# Patient Record
Sex: Female | Born: 1935 | Race: White | Hispanic: No | Marital: Single
Health system: Southern US, Community
[De-identification: ages and names within clinical notes are randomized; demographics above are authoritative.]

---

## 2004-04-27 ENCOUNTER — Ambulatory Visit: Payer: Self-pay | Admitting: Physician Assistant

## 2004-05-21 ENCOUNTER — Ambulatory Visit: Payer: Self-pay | Admitting: Unknown Physician Specialty

## 2004-05-25 ENCOUNTER — Ambulatory Visit: Payer: Self-pay | Admitting: Unknown Physician Specialty

## 2005-02-07 ENCOUNTER — Ambulatory Visit: Payer: Self-pay | Admitting: Internal Medicine

## 2005-11-18 ENCOUNTER — Ambulatory Visit: Payer: Self-pay | Admitting: Rheumatology

## 2006-01-12 ENCOUNTER — Other Ambulatory Visit: Payer: Self-pay

## 2006-01-12 ENCOUNTER — Ambulatory Visit: Payer: Self-pay | Admitting: Unknown Physician Specialty

## 2006-01-19 ENCOUNTER — Ambulatory Visit: Payer: Self-pay | Admitting: Unknown Physician Specialty

## 2006-06-27 ENCOUNTER — Ambulatory Visit: Payer: Self-pay | Admitting: Internal Medicine

## 2006-07-04 ENCOUNTER — Ambulatory Visit: Payer: Self-pay | Admitting: Unknown Physician Specialty

## 2006-11-09 ENCOUNTER — Ambulatory Visit: Payer: Self-pay

## 2007-02-07 ENCOUNTER — Ambulatory Visit: Payer: Self-pay | Admitting: Internal Medicine

## 2007-02-15 ENCOUNTER — Ambulatory Visit: Payer: Self-pay | Admitting: Internal Medicine

## 2007-03-10 ENCOUNTER — Ambulatory Visit: Payer: Self-pay | Admitting: Internal Medicine

## 2007-04-09 ENCOUNTER — Ambulatory Visit: Payer: Self-pay | Admitting: Internal Medicine

## 2007-05-10 ENCOUNTER — Ambulatory Visit: Payer: Self-pay | Admitting: Internal Medicine

## 2007-06-10 ENCOUNTER — Ambulatory Visit: Payer: Self-pay | Admitting: Internal Medicine

## 2007-06-19 ENCOUNTER — Other Ambulatory Visit: Payer: Self-pay

## 2007-06-19 ENCOUNTER — Ambulatory Visit: Payer: Self-pay | Admitting: Obstetrics and Gynecology

## 2007-07-03 ENCOUNTER — Inpatient Hospital Stay: Payer: Self-pay | Admitting: Obstetrics and Gynecology

## 2007-07-08 ENCOUNTER — Ambulatory Visit: Payer: Self-pay | Admitting: Internal Medicine

## 2007-08-08 ENCOUNTER — Ambulatory Visit: Payer: Self-pay | Admitting: Internal Medicine

## 2007-08-10 ENCOUNTER — Ambulatory Visit: Payer: Self-pay | Admitting: Internal Medicine

## 2007-09-06 ENCOUNTER — Ambulatory Visit: Payer: Self-pay | Admitting: Internal Medicine

## 2007-09-07 ENCOUNTER — Ambulatory Visit: Payer: Self-pay | Admitting: Internal Medicine

## 2007-10-26 ENCOUNTER — Ambulatory Visit: Payer: Self-pay | Admitting: Internal Medicine

## 2007-10-31 ENCOUNTER — Ambulatory Visit: Payer: Self-pay | Admitting: Orthopedic Surgery

## 2007-11-01 ENCOUNTER — Ambulatory Visit: Payer: Self-pay | Admitting: Orthopedic Surgery

## 2007-11-07 ENCOUNTER — Ambulatory Visit: Payer: Self-pay | Admitting: Internal Medicine

## 2007-12-08 ENCOUNTER — Ambulatory Visit: Payer: Self-pay | Admitting: Internal Medicine

## 2008-01-08 ENCOUNTER — Ambulatory Visit: Payer: Self-pay | Admitting: Internal Medicine

## 2008-02-07 ENCOUNTER — Ambulatory Visit: Payer: Self-pay | Admitting: Internal Medicine

## 2008-03-09 ENCOUNTER — Ambulatory Visit: Payer: Self-pay | Admitting: Internal Medicine

## 2008-03-14 ENCOUNTER — Ambulatory Visit: Payer: Self-pay | Admitting: Internal Medicine

## 2008-04-08 ENCOUNTER — Ambulatory Visit: Payer: Self-pay | Admitting: Internal Medicine

## 2008-05-09 ENCOUNTER — Ambulatory Visit: Payer: Self-pay | Admitting: Internal Medicine

## 2008-06-09 ENCOUNTER — Ambulatory Visit: Payer: Self-pay | Admitting: Internal Medicine

## 2008-07-07 ENCOUNTER — Ambulatory Visit: Payer: Self-pay | Admitting: Internal Medicine

## 2008-08-07 ENCOUNTER — Ambulatory Visit: Payer: Self-pay | Admitting: Internal Medicine

## 2008-08-11 ENCOUNTER — Ambulatory Visit: Payer: Self-pay | Admitting: Internal Medicine

## 2008-08-29 ENCOUNTER — Emergency Department: Payer: Self-pay | Admitting: Emergency Medicine

## 2008-08-29 ENCOUNTER — Inpatient Hospital Stay (HOSPITAL_COMMUNITY): Admission: AD | Admit: 2008-08-29 | Discharge: 2008-08-30 | Payer: Self-pay | Admitting: Neurosurgery

## 2008-09-01 ENCOUNTER — Ambulatory Visit: Payer: Self-pay | Admitting: Orthopedic Surgery

## 2008-09-06 ENCOUNTER — Ambulatory Visit: Payer: Self-pay | Admitting: Internal Medicine

## 2008-09-11 ENCOUNTER — Ambulatory Visit: Payer: Self-pay | Admitting: Internal Medicine

## 2008-09-12 ENCOUNTER — Ambulatory Visit: Payer: Self-pay | Admitting: Internal Medicine

## 2008-10-02 ENCOUNTER — Ambulatory Visit: Payer: Self-pay | Admitting: Neurosurgery

## 2008-10-07 ENCOUNTER — Ambulatory Visit: Payer: Self-pay | Admitting: Internal Medicine

## 2008-11-06 ENCOUNTER — Ambulatory Visit: Payer: Self-pay | Admitting: Internal Medicine

## 2008-12-03 ENCOUNTER — Inpatient Hospital Stay (HOSPITAL_COMMUNITY): Admission: RE | Admit: 2008-12-03 | Discharge: 2008-12-05 | Payer: Self-pay | Admitting: Neurosurgery

## 2008-12-07 ENCOUNTER — Ambulatory Visit: Payer: Self-pay | Admitting: Internal Medicine

## 2009-01-07 ENCOUNTER — Ambulatory Visit: Payer: Self-pay | Admitting: Internal Medicine

## 2009-02-06 ENCOUNTER — Ambulatory Visit: Payer: Self-pay | Admitting: Internal Medicine

## 2009-03-09 ENCOUNTER — Ambulatory Visit: Payer: Self-pay | Admitting: Internal Medicine

## 2009-04-08 ENCOUNTER — Ambulatory Visit: Payer: Self-pay | Admitting: Internal Medicine

## 2009-05-09 ENCOUNTER — Ambulatory Visit: Payer: Self-pay | Admitting: Internal Medicine

## 2009-05-17 ENCOUNTER — Emergency Department: Payer: Self-pay | Admitting: Internal Medicine

## 2009-05-22 ENCOUNTER — Inpatient Hospital Stay: Payer: Self-pay | Admitting: Internal Medicine

## 2009-06-09 ENCOUNTER — Ambulatory Visit: Payer: Self-pay | Admitting: Internal Medicine

## 2009-07-06 ENCOUNTER — Encounter: Payer: Self-pay | Admitting: Internal Medicine

## 2009-07-07 ENCOUNTER — Encounter: Payer: Self-pay | Admitting: Internal Medicine

## 2009-07-07 ENCOUNTER — Ambulatory Visit: Payer: Self-pay | Admitting: Internal Medicine

## 2009-08-03 ENCOUNTER — Ambulatory Visit: Payer: Self-pay | Admitting: Internal Medicine

## 2009-08-07 ENCOUNTER — Ambulatory Visit: Payer: Self-pay | Admitting: Internal Medicine

## 2009-08-11 ENCOUNTER — Encounter: Payer: Self-pay | Admitting: Internal Medicine

## 2009-08-17 ENCOUNTER — Ambulatory Visit: Payer: Self-pay | Admitting: Internal Medicine

## 2009-09-06 ENCOUNTER — Ambulatory Visit: Payer: Self-pay | Admitting: Internal Medicine

## 2009-09-14 ENCOUNTER — Ambulatory Visit: Payer: Self-pay | Admitting: Internal Medicine

## 2009-10-07 ENCOUNTER — Ambulatory Visit: Payer: Self-pay | Admitting: Internal Medicine

## 2009-11-06 ENCOUNTER — Ambulatory Visit: Payer: Self-pay | Admitting: Internal Medicine

## 2009-12-07 ENCOUNTER — Ambulatory Visit: Payer: Self-pay | Admitting: Internal Medicine

## 2010-01-07 ENCOUNTER — Ambulatory Visit: Payer: Self-pay | Admitting: Internal Medicine

## 2010-02-06 ENCOUNTER — Ambulatory Visit: Payer: Self-pay | Admitting: Oncology

## 2010-02-06 ENCOUNTER — Ambulatory Visit: Payer: Self-pay | Admitting: Internal Medicine

## 2010-03-09 ENCOUNTER — Ambulatory Visit: Payer: Self-pay | Admitting: Internal Medicine

## 2010-04-12 ENCOUNTER — Ambulatory Visit: Payer: Self-pay | Admitting: Internal Medicine

## 2010-05-09 ENCOUNTER — Ambulatory Visit: Payer: Self-pay | Admitting: Internal Medicine

## 2010-06-09 ENCOUNTER — Ambulatory Visit: Payer: Self-pay | Admitting: Internal Medicine

## 2010-07-08 ENCOUNTER — Ambulatory Visit: Payer: Self-pay | Admitting: Internal Medicine

## 2010-08-08 ENCOUNTER — Ambulatory Visit: Payer: Self-pay | Admitting: Internal Medicine

## 2010-08-15 LAB — CBC
MCHC: 34.6 g/dL (ref 30.0–36.0)
MCV: 122.4 fL — ABNORMAL HIGH (ref 78.0–100.0)
Platelets: 421 10*3/uL — ABNORMAL HIGH (ref 150–400)
RDW: 13.9 % (ref 11.5–15.5)

## 2010-08-15 LAB — BASIC METABOLIC PANEL
BUN: 16 mg/dL (ref 6–23)
CO2: 30 mEq/L (ref 19–32)
Chloride: 101 mEq/L (ref 96–112)
Creatinine, Ser: 0.58 mg/dL (ref 0.4–1.2)
Glucose, Bld: 95 mg/dL (ref 70–99)

## 2010-08-15 LAB — PROTIME-INR: Prothrombin Time: 13 seconds (ref 11.6–15.2)

## 2010-09-07 ENCOUNTER — Ambulatory Visit: Payer: Self-pay | Admitting: Internal Medicine

## 2010-09-16 ENCOUNTER — Ambulatory Visit: Payer: Self-pay | Admitting: Internal Medicine

## 2010-09-21 NOTE — Op Note (Signed)
NAMEALAZAE, CRYMES                ACCOUNT NO.:  [id number]   MEDICAL RECORD NO.:  [id number]          PATIENT TYPE:  INP   LOCATION:  3009                         FACILITY:  [facility]   PHYSICIAN:  Cristi Loron, M.D.DATE OF BIRTH:  [birth year]   DATE OF PROCEDURE:  12/03/2008  DATE OF DISCHARGE:                               OPERATIVE REPORT   BRIEF HISTORY:  The patient is a 77 year old white female who has had a  neck pain.  She was worked up with cervical x-rays, MRI, CT scans etc.  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  I discussed the various treatment options with the  patient including surgery.  She has weighed the risks, benefits, and  alternatives of surgery and decided to proceed with C1-2 posterior  instrumentation and fusion.   PREOPERATIVE DIAGNOSIS:  Atlantoaxial instability and cervicalgia.   POSTOPERATIVE DIAGNOSIS:  Atlantoaxial instability and cervicalgia.   PROCEDURE:  C1-2 transarticular screw fixation; sublaminar spinous wire  stronger cables; C1-2 posterior fusion with local morselized autograft  bone, iliac crest tricortical structural bone and structural allograft  bone and bone morphogenic protein.   SURGEON:  Cristi Loron, MD   ASSISTANT:  Danae Orleans. Venetia Maxon, MD   ANESTHESIA:  General endotracheal.   ESTIMATED BLOOD LOSS:  100 mL.   SPECIMENS:  None.   DRAINS:  None.   COMPLICATIONS:  None.   DESCRIPTION OF PROCEDURE:  The patient was brought to the operating room  by anesthesia team.  General endotracheal anesthesia was induced.  The  radiolucent Mayfield headrest was applied to the patient's calvarium.  The patient was then turned to the prone position on chest rolls.  A  good neutral alignment was confirmed with fluoroscopy.  The patient's  suboccipital region was then shaved with clippers and then prepared with  Betadine scrub and Betadine solution.  Sterile drapes were applied.  I  then  injected the area to be incised with Marcaine with epinephrine  solution.  I used a scalpel to make a midline incision at the occipital  cervical junction.  I used electrocautery to perform a bilateral  subperiosteal dissection exposing the spinous process and lamina of C1,  C2 and C3.  We confirmed our location using intraoperative fluoroscopy.  I then used the awl to create an entry point in the  inferior C2 lamina.  Under fluoroscopic guidance, we placed a K-wire  across the C1-2 joint into the C1 lateral mass.  This was done under  both AP and lateral biplanar fluoroscopy.   DICTATION ENDS HERE      Cristi Loron, M.D.  Electronically Signed     JDJ/MEDQ  D:  12/03/2008  T:  12/04/2008  Job:  [id number]

## 2010-09-21 NOTE — H&P (Signed)
NAMEDENIM, START                ACCOUNT NO.:  [id number]   MEDICAL RECORD NO.:  [id number]          PATIENT TYPE:  INP   LOCATION:  3009                         FACILITY:  [facility]   PHYSICIAN:  Hilda Lias, M.D.   DATE OF BIRTH:  [birth year]   DATE OF ADMISSION:  08/29/2008  DATE OF DISCHARGE:                              HISTORY & PHYSICAL   Ms. Waterbury is a lady who went to her hematologist today involving the  complaint of some dizziness.  She was told that the dizziness was not  coming from the medication, and they did a CT scan of the head just to  be sure.  Because they found some abnormality, she was sent immediately  to [facility], where a CT scan of the head and the  neck was obtained which showed some dislocation of C1-C2.  We were  called for transfer.  They tried to transfer her to [facility], but  there was no bed available.  We were called for evaluation.  The patient  came by ambulance with a cervical collar.  The patient denies any  headache, denies any weakness in the upper or lower extremity, denies  any problem with bladder or bowel.  The patient had been very active up  to the point that yesterday she was swimming.  She denies any history of  any trauma whatsoever.   PAST MEDICAL HISTORY:  She had hysterectomy.   REVIEW OF SYSTEMS:  Positive for decrease of hearing.  She has some  bleeding tendencies.  We have no record available about that.   MEDICATIONS:  See the chart.   FAMILY HISTORY:  Unremarkable.   PHYSICAL EXAMINATION:  GENERAL:  The patient is in the bedrest strictly.  She has a [place] collar in place.  HEAD, EARS, EYES, NOSE, AND THROAT:  Normal.  NECK:  She has no tenderness whatsoever in the cervical area.  There is  no tenderness or appreciable mass.  LUNGS:  Clear.  CARDIAC:  Heart sound normal.  ABDOMEN:  Normal.  EXTREMITIES:  There is some mild changes in the joint secondary to  arthritis.  NEURO:  Mental  status oriented x3.  Cranial nerve normal.  Strength  normal.  Reflexes normal.  Sensation normal.  Coordination is normal.   LABORATORY DATA:  I reviewed the CT scan of the head and neck and she  has a C1-C2 dislocation.   CLINICAL IMPRESSION:  C1-C2 dislocation with abnormal neurological  examination.   RECOMMENDATIONS:  I talked to her, who is to be a Engineer, civil (consulting), and her  husband.  I mentioned to them that this is no acute problem that this  problem happened while ago, it is not a life threatening.  Nevertheless,  we are going to change her brace for an Aspen brace.  I am going to  obtain a lateral cervical spine with flexion and extension to see  how much mobility does she have.  Eventually, she is going to require  surgery, which is the C1-C2 fusion.  Again, I emphasized to both of them  that this probably  is not emergency and based on the x-ray and they are  really unchanged and this can be scheduled as an outpatient procedure.           ______________________________  Hilda Lias, M.D.     EB/MEDQ  D:  08/29/2008  T:  08/30/2008  Job:  [id number]

## 2010-09-21 NOTE — Op Note (Signed)
Carol Acevedo, Carol Acevedo                ACCOUNT NO.:  [id number]   MEDICAL RECORD NO.:  [id number]          PATIENT TYPE:  INP   LOCATION:  3009                         FACILITY:  [facility]   PHYSICIAN:  Cristi Loron, M.D.DATE OF BIRTH:  04-Apr-1933   DATE OF PROCEDURE:  12/03/2008  DATE OF DISCHARGE:                               OPERATIVE REPORT   ADDENDUM   We placed a 38-mm cannulated screw on the left, a 36-mm cannulated screw  on the right, again we got a good bony purchase.  We then grasped the C2  spinous process and noted that the C1, C2 were fixated together.  We did  not encounter any untoward bloating or spinal fluid leakage at the  placement of the screws.   We the turned our attention to the placement of the sublaminar wires, we  placed a standard Songer cable under the lamina of C1.  We then looped  around its spinous process of C2.  We then obtained iliac crest  allograft structural bone and then decorticated the C1, C2 lamina.  We  placed the allograft bone over this decorticated C1 and C2 lamina.  We  got a good fixation of the graft against the lamina.  I should also  mention that we placed morphogenic protein-soaked collagen sponges in  between the allograft bone and the lamina.  We then obtained hemostasis  using bipolar electrocautery.  We removed the retractors and then  reapproximated the patient's cervical fascia wound with interrupted #0  Vicryl suture, subcutaneous tissue with a 2-0 Vicryl suture, and the  skin with Steri-Strips and benzoin.   I should also mention that in order to place the K-wires, we did create  2 separate stab wounds to get a better angle and closed these with  interrupted 2-0 Vicryl  suture and Steri-Strips with benzoin. The wound  was then coated with bacitracin ointment and sterile dressing was  applied.  Drapes were removed, and the patient was subsequently returned  to supine position.  The Mayfield 3-point headrest was removed  from  calvarium.  The patient was subsequently extubated by the anesthesia  team.  I should also mention that despite using a radiolucent head  holder there was limited visualization in AP plane because of the  patient's teeth and fillings.      Cristi Loron, M.D.  Electronically Signed     JDJ/MEDQ  D:  12/03/2008  T:  12/03/2008  Job:  [id number]

## 2010-10-08 ENCOUNTER — Ambulatory Visit: Payer: Self-pay | Admitting: Internal Medicine

## 2010-11-07 ENCOUNTER — Ambulatory Visit: Payer: Self-pay | Admitting: Internal Medicine

## 2010-12-03 ENCOUNTER — Ambulatory Visit: Payer: Self-pay | Admitting: Unknown Physician Specialty

## 2010-12-08 ENCOUNTER — Ambulatory Visit: Payer: Self-pay | Admitting: Internal Medicine

## 2011-01-05 ENCOUNTER — Ambulatory Visit: Payer: Self-pay | Admitting: Internal Medicine

## 2011-01-13 ENCOUNTER — Ambulatory Visit: Payer: Self-pay | Admitting: Internal Medicine

## 2011-02-07 ENCOUNTER — Ambulatory Visit: Payer: Self-pay | Admitting: Internal Medicine

## 2011-03-10 ENCOUNTER — Ambulatory Visit: Payer: Self-pay | Admitting: Internal Medicine

## 2011-04-09 ENCOUNTER — Ambulatory Visit: Payer: Self-pay | Admitting: Internal Medicine

## 2011-05-10 ENCOUNTER — Ambulatory Visit: Payer: Self-pay | Admitting: Internal Medicine

## 2011-05-16 LAB — CBC CANCER CENTER
Basophil #: 0 x10 3/mm (ref 0.0–0.1)
Eosinophil %: 0.4 %
HCT: 37.6 % (ref 35.0–47.0)
Lymphocyte #: 1.3 x10 3/mm (ref 1.0–3.6)
MCH: 46.2 pg — ABNORMAL HIGH (ref 26.0–34.0)
MCHC: 34.7 g/dL (ref 32.0–36.0)
MCV: 133 fL — ABNORMAL HIGH (ref 80–100)
Monocyte #: 0.4 x10 3/mm (ref 0.0–0.7)
Neutrophil #: 3.1 x10 3/mm (ref 1.4–6.5)
Platelet: 624 x10 3/mm — ABNORMAL HIGH (ref 150–440)
RDW: 15.8 % — ABNORMAL HIGH (ref 11.5–14.5)
WBC: 4.8 x10 3/mm (ref 3.6–11.0)

## 2011-05-30 LAB — CBC CANCER CENTER
Basophil #: 0 x10 3/mm (ref 0.0–0.1)
Basophil %: 0.2 %
HCT: 36.6 % (ref 35.0–47.0)
HGB: 12.7 g/dL (ref 12.0–16.0)
Lymphocyte #: 1.1 x10 3/mm (ref 1.0–3.6)
MCH: 46.4 pg — ABNORMAL HIGH (ref 26.0–34.0)
MCV: 133 fL — ABNORMAL HIGH (ref 80–100)
Monocyte #: 0.2 x10 3/mm (ref 0.0–0.7)
Monocyte %: 5 %
Neutrophil #: 2.9 x10 3/mm (ref 1.4–6.5)
Platelet: 306 x10 3/mm (ref 150–440)
RDW: 16 % — ABNORMAL HIGH (ref 11.5–14.5)
WBC: 4.3 x10 3/mm (ref 3.6–11.0)

## 2011-06-06 LAB — CBC CANCER CENTER
Basophil %: 0.2 %
Eosinophil %: 0.9 %
HGB: 12.6 g/dL (ref 12.0–16.0)
Lymphocyte #: 1 x10 3/mm (ref 1.0–3.6)
Lymphocyte %: 26.8 %
MCV: 134 fL — ABNORMAL HIGH (ref 80–100)
Monocyte %: 5.5 %
Neutrophil #: 2.5 x10 3/mm (ref 1.4–6.5)
RBC: 2.7 10*6/uL — ABNORMAL LOW (ref 3.80–5.20)
RDW: 15.8 % — ABNORMAL HIGH (ref 11.5–14.5)
WBC: 3.8 x10 3/mm (ref 3.6–11.0)

## 2011-06-10 ENCOUNTER — Ambulatory Visit: Payer: Self-pay | Admitting: Internal Medicine

## 2011-06-13 LAB — CBC CANCER CENTER
Basophil %: 0.5 %
Eosinophil #: 0 x10 3/mm (ref 0.0–0.7)
Eosinophil %: 0.8 %
HCT: 36.7 % (ref 35.0–47.0)
HGB: 12.7 g/dL (ref 12.0–16.0)
Lymphocyte #: 1.1 x10 3/mm (ref 1.0–3.6)
Lymphocyte %: 30.6 %
MCH: 46.8 pg — ABNORMAL HIGH (ref 26.0–34.0)
MCHC: 34.6 g/dL (ref 32.0–36.0)
Monocyte #: 0.2 x10 3/mm (ref 0.0–0.7)
Neutrophil #: 2.3 x10 3/mm (ref 1.4–6.5)
Neutrophil %: 63.8 %
RBC: 2.71 10*6/uL — ABNORMAL LOW (ref 3.80–5.20)

## 2011-06-23 LAB — CBC CANCER CENTER
Eosinophil %: 0.3 %
HCT: 35.9 % (ref 35.0–47.0)
HGB: 12.3 g/dL (ref 12.0–16.0)
Lymphocyte #: 1.1 x10 3/mm (ref 1.0–3.6)
MCH: 46.1 pg — ABNORMAL HIGH (ref 26.0–34.0)
MCHC: 34.2 g/dL (ref 32.0–36.0)
MCV: 135 fL — ABNORMAL HIGH (ref 80–100)
Monocyte #: 0.3 x10 3/mm (ref 0.0–0.7)
Monocyte %: 7.5 %
Neutrophil #: 2.4 x10 3/mm (ref 1.4–6.5)
RBC: 2.66 10*6/uL — ABNORMAL LOW (ref 3.80–5.20)
WBC: 3.8 x10 3/mm (ref 3.6–11.0)

## 2011-07-04 LAB — CBC CANCER CENTER
Basophil %: 0.2 %
Eosinophil #: 0 x10 3/mm (ref 0.0–0.7)
Eosinophil %: 0.5 %
HCT: 35.5 % (ref 35.0–47.0)
Lymphocyte #: 1.2 x10 3/mm (ref 1.0–3.6)
MCH: 46.7 pg — ABNORMAL HIGH (ref 26.0–34.0)
MCHC: 34.4 g/dL (ref 32.0–36.0)
Monocyte #: 0.3 x10 3/mm (ref 0.0–0.7)
Neutrophil #: 3.2 x10 3/mm (ref 1.4–6.5)
RBC: 2.61 10*6/uL — ABNORMAL LOW (ref 3.80–5.20)
WBC: 4.7 x10 3/mm (ref 3.6–11.0)

## 2011-07-08 ENCOUNTER — Ambulatory Visit: Payer: Self-pay | Admitting: Internal Medicine

## 2011-07-18 LAB — CBC CANCER CENTER
Basophil #: 0 x10 3/mm (ref 0.0–0.1)
HCT: 35.5 % (ref 35.0–47.0)
HGB: 12.4 g/dL (ref 12.0–16.0)
Lymphocyte %: 24.5 %
MCH: 47.3 pg — ABNORMAL HIGH (ref 26.0–34.0)
MCHC: 35 g/dL (ref 32.0–36.0)
Monocyte %: 5.1 %
Neutrophil %: 69.3 %
Platelet: 284 x10 3/mm (ref 150–440)
RBC: 2.63 10*6/uL — ABNORMAL LOW (ref 3.80–5.20)
RDW: 15.4 % — ABNORMAL HIGH (ref 11.5–14.5)

## 2011-08-08 ENCOUNTER — Ambulatory Visit: Payer: Self-pay | Admitting: Internal Medicine

## 2011-08-15 LAB — CBC CANCER CENTER
HGB: 12.3 g/dL (ref 12.0–16.0)
Lymphocyte #: 1.1 x10 3/mm (ref 1.0–3.6)
Lymphocyte %: 29.8 %
MCH: 46.4 pg — ABNORMAL HIGH (ref 26.0–34.0)
Monocyte #: 0.3 x10 3/mm (ref 0.0–0.7)
Monocyte %: 8.4 %
Neutrophil %: 61.2 %
Platelet: 671 x10 3/mm — ABNORMAL HIGH (ref 150–440)
RBC: 2.66 10*6/uL — ABNORMAL LOW (ref 3.80–5.20)
RDW: 16.1 % — ABNORMAL HIGH (ref 11.5–14.5)
WBC: 3.6 x10 3/mm (ref 3.6–11.0)

## 2011-08-29 LAB — CBC CANCER CENTER
Basophil #: 0 x10 3/mm (ref 0.0–0.1)
Basophil %: 0.4 %
Eosinophil #: 0 x10 3/mm (ref 0.0–0.7)
Eosinophil %: 0.4 %
HGB: 12.8 g/dL (ref 12.0–16.0)
Lymphocyte #: 1.1 x10 3/mm (ref 1.0–3.6)
MCV: 137 fL — ABNORMAL HIGH (ref 80–100)
Monocyte %: 6.6 %
Platelet: 370 x10 3/mm (ref 150–440)

## 2011-09-07 ENCOUNTER — Ambulatory Visit: Payer: Self-pay | Admitting: Internal Medicine

## 2011-09-19 ENCOUNTER — Ambulatory Visit: Payer: Self-pay | Admitting: Internal Medicine

## 2011-09-23 ENCOUNTER — Ambulatory Visit: Payer: Self-pay | Admitting: Unknown Physician Specialty

## 2011-09-26 ENCOUNTER — Ambulatory Visit: Payer: Self-pay | Admitting: Internal Medicine

## 2011-09-26 LAB — CBC CANCER CENTER
Basophil %: 0.4 %
Eosinophil #: 0 x10 3/mm (ref 0.0–0.7)
Eosinophil %: 0.4 %
HCT: 38.1 % (ref 35.0–47.0)
Lymphocyte %: 21.8 %
MCH: 46.7 pg — ABNORMAL HIGH (ref 26.0–34.0)
MCHC: 34.2 g/dL (ref 32.0–36.0)
MCV: 137 fL — ABNORMAL HIGH (ref 80–100)
Monocyte #: 0.3 x10 3/mm (ref 0.2–0.9)
Monocyte %: 7.1 %
Neutrophil #: 2.7 x10 3/mm (ref 1.4–6.5)
Neutrophil %: 70.3 %
RBC: 2.79 10*6/uL — ABNORMAL LOW (ref 3.80–5.20)
RDW: 12.8 % (ref 11.5–14.5)

## 2011-10-08 ENCOUNTER — Ambulatory Visit: Payer: Self-pay | Admitting: Internal Medicine

## 2011-10-24 LAB — CBC CANCER CENTER
Basophil %: 0.5 %
HCT: 37.4 % (ref 35.0–47.0)
HGB: 12.8 g/dL (ref 12.0–16.0)
Lymphocyte #: 0.9 x10 3/mm — ABNORMAL LOW (ref 1.0–3.6)
Lymphocyte %: 19.4 %
MCH: 46.2 pg — ABNORMAL HIGH (ref 26.0–34.0)
MCHC: 34.2 g/dL (ref 32.0–36.0)
MCV: 135 fL — ABNORMAL HIGH (ref 80–100)
Neutrophil %: 72.9 %
Platelet: 327 x10 3/mm (ref 150–440)
RBC: 2.77 10*6/uL — ABNORMAL LOW (ref 3.80–5.20)
RDW: 12.6 % (ref 11.5–14.5)

## 2011-11-07 ENCOUNTER — Ambulatory Visit: Payer: Self-pay | Admitting: Internal Medicine

## 2011-11-18 ENCOUNTER — Ambulatory Visit: Payer: Self-pay | Admitting: Internal Medicine

## 2011-11-18 LAB — CBC CANCER CENTER
Eosinophil #: 0 x10 3/mm (ref 0.0–0.7)
Eosinophil %: 0.3 %
Lymphocyte #: 0.6 x10 3/mm — ABNORMAL LOW (ref 1.0–3.6)
MCH: 44.6 pg — ABNORMAL HIGH (ref 26.0–34.0)
Neutrophil #: 1.5 x10 3/mm (ref 1.4–6.5)

## 2011-11-28 LAB — CBC CANCER CENTER
Basophil #: 0 x10 3/mm (ref 0.0–0.1)
Basophil %: 0.4 %
Eosinophil %: 0.3 %
HCT: 34.3 % — ABNORMAL LOW (ref 35.0–47.0)
HGB: 11.4 g/dL — ABNORMAL LOW (ref 12.0–16.0)
Lymphocyte #: 1.6 x10 3/mm (ref 1.0–3.6)
MCH: 43.6 pg — ABNORMAL HIGH (ref 26.0–34.0)
MCHC: 33.1 g/dL (ref 32.0–36.0)
Monocyte #: 0.5 x10 3/mm (ref 0.2–0.9)
Neutrophil #: 3 x10 3/mm (ref 1.4–6.5)
Neutrophil %: 58.8 %
RBC: 2.6 10*6/uL — ABNORMAL LOW (ref 3.80–5.20)

## 2011-12-03 ENCOUNTER — Emergency Department: Payer: Self-pay | Admitting: Emergency Medicine

## 2011-12-08 ENCOUNTER — Ambulatory Visit: Payer: Self-pay

## 2011-12-09 ENCOUNTER — Ambulatory Visit: Payer: Self-pay | Admitting: Internal Medicine

## 2011-12-09 LAB — CBC CANCER CENTER
Eosinophil #: 0 x10 3/mm (ref 0.0–0.7)
HCT: 30.1 % — ABNORMAL LOW (ref 35.0–47.0)
MCH: 46.1 pg — ABNORMAL HIGH (ref 26.0–34.0)
MCHC: 34.4 g/dL (ref 32.0–36.0)
MCV: 134 fL — ABNORMAL HIGH (ref 80–100)
Monocyte #: 0.3 x10 3/mm (ref 0.2–0.9)
Monocyte %: 7 %
Platelet: 371 x10 3/mm (ref 150–440)
RDW: 13 % (ref 11.5–14.5)
WBC: 4.2 x10 3/mm (ref 3.6–11.0)

## 2011-12-18 ENCOUNTER — Emergency Department: Payer: Self-pay | Admitting: Emergency Medicine

## 2011-12-18 LAB — COMPREHENSIVE METABOLIC PANEL
Albumin: 3.9 g/dL (ref 3.4–5.0)
Alkaline Phosphatase: 92 U/L (ref 50–136)
BUN: 16 mg/dL (ref 7–18)
Bilirubin,Total: 0.4 mg/dL (ref 0.2–1.0)
Creatinine: 0.55 mg/dL — ABNORMAL LOW (ref 0.60–1.30)
Glucose: 97 mg/dL (ref 65–99)
SGPT (ALT): 20 U/L (ref 12–78)
Sodium: 138 mmol/L (ref 136–145)
Total Protein: 7.1 g/dL (ref 6.4–8.2)

## 2011-12-18 LAB — CBC WITH DIFFERENTIAL/PLATELET
Basophil %: 0.3 %
Eosinophil #: 0 10*3/uL (ref 0.0–0.7)
Eosinophil %: 0.4 %
HGB: 11.3 g/dL — ABNORMAL LOW (ref 12.0–16.0)
Lymphocyte #: 0.9 10*3/uL — ABNORMAL LOW (ref 1.0–3.6)
MCH: 46 pg — ABNORMAL HIGH (ref 26.0–34.0)
Monocyte #: 0.2 x10 3/mm (ref 0.2–0.9)
Monocyte %: 3.3 %
RBC: 2.47 10*6/uL — ABNORMAL LOW (ref 3.80–5.20)

## 2011-12-18 LAB — TROPONIN I: Troponin-I: <0.02 ng/mL

## 2011-12-19 ENCOUNTER — Emergency Department: Payer: Self-pay | Admitting: Emergency Medicine

## 2011-12-19 LAB — URINALYSIS, COMPLETE
Bacteria: NONE SEEN
Bilirubin,UR: NEGATIVE
Blood: NEGATIVE
Glucose,UR: NEGATIVE mg/dL (ref 0–75)
Ketone: NEGATIVE
Nitrite: NEGATIVE
Specific Gravity: 1.015 (ref 1.003–1.030)
Squamous Epithelial: <1
WBC UR: 1 /HPF (ref 0–5)

## 2011-12-19 LAB — COMPREHENSIVE METABOLIC PANEL
Anion Gap: 5 — ABNORMAL LOW (ref 7–16)
Bilirubin,Total: 0.5 mg/dL (ref 0.2–1.0)
Chloride: 100 mmol/L (ref 98–107)
Co2: 30 mmol/L (ref 21–32)
Creatinine: 0.58 mg/dL — ABNORMAL LOW (ref 0.60–1.30)
EGFR (African American): >60
EGFR (Non-African Amer.): >60
Osmolality: 270 (ref 275–301)
Potassium: 4.2 mmol/L (ref 3.5–5.1)
SGPT (ALT): 21 U/L (ref 12–78)
Sodium: 135 mmol/L — ABNORMAL LOW (ref 136–145)

## 2011-12-19 LAB — CBC
HGB: 11.2 g/dL — ABNORMAL LOW (ref 12.0–16.0)
MCH: 45.4 pg — ABNORMAL HIGH (ref 26.0–34.0)
MCV: 133 fL — ABNORMAL HIGH (ref 80–100)
RBC: 2.47 10*6/uL — ABNORMAL LOW (ref 3.80–5.20)

## 2011-12-19 LAB — TROPONIN I: Troponin-I: <0.02 ng/mL

## 2011-12-26 LAB — CBC CANCER CENTER
Basophil #: 0 x10 3/mm (ref 0.0–0.1)
Basophil %: 0.7 %
Eosinophil #: 0 x10 3/mm (ref 0.0–0.7)
HGB: 10.9 g/dL — ABNORMAL LOW (ref 12.0–16.0)
Lymphocyte %: 25.6 %
MCHC: 33.2 g/dL (ref 32.0–36.0)
Monocyte #: 0.2 x10 3/mm (ref 0.2–0.9)
Neutrophil %: 67.4 %
Platelet: 451 x10 3/mm — ABNORMAL HIGH (ref 150–440)
RDW: 14.2 % (ref 11.5–14.5)

## 2012-01-10 ENCOUNTER — Ambulatory Visit: Payer: Self-pay | Admitting: Orthopedic Surgery

## 2012-01-11 ENCOUNTER — Ambulatory Visit: Payer: Self-pay | Admitting: Internal Medicine

## 2012-01-13 LAB — CBC CANCER CENTER
Basophil #: 0 x10 3/mm (ref 0.0–0.1)
Basophil %: 0.6 %
Eosinophil %: 0.5 %
HCT: 28.7 % — ABNORMAL LOW (ref 35.0–47.0)
HGB: 10.1 g/dL — ABNORMAL LOW (ref 12.0–16.0)
Lymphocyte #: 1.2 x10 3/mm (ref 1.0–3.6)
Lymphocyte %: 34.3 %
MCH: 45.4 pg — ABNORMAL HIGH (ref 26.0–34.0)
Monocyte %: 6.9 %
Neutrophil #: 2.1 x10 3/mm (ref 1.4–6.5)
RBC: 2.22 10*6/uL — ABNORMAL LOW (ref 3.80–5.20)
RDW: 14.3 % (ref 11.5–14.5)

## 2012-01-23 LAB — CBC CANCER CENTER
HCT: 27.9 % — ABNORMAL LOW (ref 35.0–47.0)
MCHC: 33 g/dL (ref 32.0–36.0)
MCV: 129 fL — ABNORMAL HIGH (ref 80–100)
RDW: 14.5 % (ref 11.5–14.5)

## 2012-01-30 LAB — CBC CANCER CENTER
Basophil #: 0 x10 3/mm (ref 0.0–0.1)
Eosinophil #: 0 x10 3/mm (ref 0.0–0.7)
HGB: 8.5 g/dL — ABNORMAL LOW (ref 12.0–16.0)
Lymphocyte #: 1.5 x10 3/mm (ref 1.0–3.6)
Monocyte #: 0.3 x10 3/mm (ref 0.2–0.9)
Monocyte %: 7.7 %
Neutrophil #: 1.8 x10 3/mm (ref 1.4–6.5)
Platelet: 183 x10 3/mm (ref 150–440)
RBC: 2.02 10*6/uL — ABNORMAL LOW (ref 3.80–5.20)
WBC: 3.6 x10 3/mm (ref 3.6–11.0)

## 2012-01-30 LAB — FERRITIN: Ferritin (ARMC): 164 ng/mL (ref 8–388)

## 2012-02-02 LAB — CBC CANCER CENTER
Basophil %: 0.3 %
Eosinophil #: 0 x10 3/mm (ref 0.0–0.7)
Eosinophil %: 0.3 %
HCT: 25.9 % — ABNORMAL LOW (ref 35.0–47.0)
HGB: 8.6 g/dL — ABNORMAL LOW (ref 12.0–16.0)
Lymphocyte %: 35.5 %
MCH: 41.5 pg — ABNORMAL HIGH (ref 26.0–34.0)
MCHC: 33.2 g/dL (ref 32.0–36.0)
Neutrophil #: 2.6 x10 3/mm (ref 1.4–6.5)
RBC: 2.07 10*6/uL — ABNORMAL LOW (ref 3.80–5.20)

## 2012-02-06 LAB — CBC CANCER CENTER
Basophil #: 0.1 x10 3/mm (ref 0.0–0.1)
Eosinophil #: 0 x10 3/mm (ref 0.0–0.7)
HCT: 25.4 % — ABNORMAL LOW (ref 35.0–47.0)
Lymphocyte #: 1.5 x10 3/mm (ref 1.0–3.6)
MCHC: 33.4 g/dL (ref 32.0–36.0)
MCV: 124 fL — ABNORMAL HIGH (ref 80–100)
Monocyte #: 0.5 x10 3/mm (ref 0.2–0.9)
Neutrophil #: 2.6 x10 3/mm (ref 1.4–6.5)
Platelet: 215 x10 3/mm (ref 150–440)
RDW: 14.7 % — ABNORMAL HIGH (ref 11.5–14.5)
WBC: 4.7 x10 3/mm (ref 3.6–11.0)

## 2012-02-06 LAB — OCCULT BLOOD X 1 CARD TO LAB, STOOL
Occult Blood, Feces: NEGATIVE
Occult Blood, Feces: NEGATIVE

## 2012-02-06 LAB — CREATININE, SERUM: EGFR (African American): >60

## 2012-02-07 ENCOUNTER — Ambulatory Visit: Payer: Self-pay | Admitting: Internal Medicine

## 2012-02-07 LAB — URINE IEP, RANDOM

## 2012-02-07 LAB — PROT IMMUNOELECTROPHORES(ARMC)

## 2012-02-09 LAB — CBC CANCER CENTER
Eosinophil: 2 %
MCHC: 33.1 g/dL (ref 32.0–36.0)
Monocytes: 3 %
Other Cells Blood: 2 %
Segmented Neutrophils: 57 %
Variant Lymphocyte: 1 %

## 2012-02-13 LAB — CANCER CENTER HEMOGLOBIN: HGB: 7.7 g/dL — ABNORMAL LOW (ref 12.0–16.0)

## 2012-02-13 LAB — CANCER CTR PLATELET CT: Platelet: 244 x10 3/mm (ref 150–440)

## 2012-02-14 ENCOUNTER — Encounter: Payer: Self-pay | Admitting: Orthopedic Surgery

## 2012-02-16 LAB — CANCER CTR PLATELET CT: Platelet: 198 x10 3/mm (ref 150–440)

## 2012-02-16 LAB — CANCER CENTER HEMOGLOBIN: HGB: 8 g/dL — ABNORMAL LOW (ref 12.0–16.0)

## 2012-02-17 ENCOUNTER — Emergency Department: Payer: Self-pay | Admitting: Emergency Medicine

## 2012-02-17 LAB — URINALYSIS, COMPLETE
Bilirubin,UR: NEGATIVE
Blood: NEGATIVE
Glucose,UR: NEGATIVE mg/dL (ref 0–75)
Leukocyte Esterase: NEGATIVE
Ph: 7 (ref 4.5–8.0)
Protein: NEGATIVE
RBC,UR: NONE SEEN /HPF (ref 0–5)
Specific Gravity: 1.011 (ref 1.003–1.030)
Squamous Epithelial: NONE SEEN

## 2012-02-17 LAB — CBC CANCER CENTER
Basophil #: 0.1 x10 3/mm (ref 0.0–0.1)
Eosinophil %: 0.6 %
HGB: 7.8 g/dL — ABNORMAL LOW (ref 12.0–16.0)
Lymphocyte #: 2 x10 3/mm (ref 1.0–3.6)
MCH: 38.3 pg — ABNORMAL HIGH (ref 26.0–34.0)
Monocyte #: 0.5 x10 3/mm (ref 0.2–0.9)
Monocyte %: 7.5 %
Neutrophil %: 58.2 %
Platelet: 195 x10 3/mm (ref 150–440)
RDW: 16.3 % — ABNORMAL HIGH (ref 11.5–14.5)

## 2012-02-17 LAB — COMPREHENSIVE METABOLIC PANEL
Albumin: 3.6 g/dL (ref 3.4–5.0)
Alkaline Phosphatase: 118 U/L (ref 50–136)
Anion Gap: 11 (ref 7–16)
BUN: 11 mg/dL (ref 7–18)
Co2: 26 mmol/L (ref 21–32)
EGFR (African American): >60
Glucose: 118 mg/dL — ABNORMAL HIGH (ref 65–99)
Osmolality: 271 (ref 275–301)
Potassium: 3.8 mmol/L (ref 3.5–5.1)
Total Protein: 7.2 g/dL (ref 6.4–8.2)

## 2012-02-22 LAB — CULTURE, BLOOD (SINGLE)

## 2012-02-23 LAB — CBC CANCER CENTER
Basophil: 1 %
Lymphocytes: 22 %
Metamyelocyte: 2 %
Monocytes: 9 %
NRBC/100 WBC: 10 /100
Platelet: 148 x10 3/mm — ABNORMAL LOW (ref 150–440)
RDW: 38 % — ABNORMAL HIGH (ref 11.5–14.5)
Segmented Neutrophils: 57 %
Variant Lymphocyte: 1 %
WBC: 4.8 x10 3/mm (ref 3.6–11.0)

## 2012-02-23 LAB — CULTURE, BLOOD (SINGLE)

## 2012-03-01 LAB — CBC CANCER CENTER
Basophil #: 0 x10 3/mm (ref 0.0–0.1)
Basophil %: 0.9 %
Eosinophil #: 0 x10 3/mm (ref 0.0–0.7)
HGB: 8.4 g/dL — ABNORMAL LOW (ref 12.0–16.0)
Lymphocyte #: 1.4 x10 3/mm (ref 1.0–3.6)
Lymphocyte %: 29.5 %
MCH: 32.5 pg (ref 26.0–34.0)
MCHC: 33.5 g/dL (ref 32.0–36.0)
MCV: 97 fL (ref 80–100)
Monocyte #: 0.4 x10 3/mm (ref 0.2–0.9)
Monocyte %: 8.8 %
Neutrophil #: 2.8 x10 3/mm (ref 1.4–6.5)
Neutrophil %: 60.4 %
RDW: 43.2 % — ABNORMAL HIGH (ref 11.5–14.5)

## 2012-03-09 ENCOUNTER — Ambulatory Visit: Payer: Self-pay | Admitting: Internal Medicine

## 2012-03-09 ENCOUNTER — Observation Stay: Payer: Self-pay | Admitting: Hematology and Oncology

## 2012-03-09 LAB — CBC CANCER CENTER
Bands: 8 %
Lymphocytes: 19 %
Myelocyte: 1 %
NRBC/100 WBC: 10 /100
Platelet: 200 x10 3/mm (ref 150–440)
RBC: 2.42 10*6/uL — ABNORMAL LOW (ref 3.80–5.20)
RDW: 46.5 % — ABNORMAL HIGH (ref 11.5–14.5)
Segmented Neutrophils: 61 %
Variant Lymphocyte: 2 %
WBC: 4 x10 3/mm (ref 3.6–11.0)

## 2012-03-10 LAB — HEMOGLOBIN: HGB: 9.1 g/dL — ABNORMAL LOW (ref 12.0–16.0)

## 2012-03-11 ENCOUNTER — Ambulatory Visit: Payer: Self-pay | Admitting: Internal Medicine

## 2012-03-12 ENCOUNTER — Telehealth: Payer: Self-pay | Admitting: Internal Medicine

## 2012-03-12 NOTE — Telephone Encounter (Signed)
Open in error

## 2012-03-16 LAB — CBC CANCER CENTER
Basophil %: 1.2 %
Eosinophil %: 0.4 %
HGB: 9.1 g/dL — ABNORMAL LOW (ref 12.0–16.0)
Lymphocyte %: 33.7 %
Monocyte %: 8.6 %
Neutrophil %: 56.1 %
RBC: 3.14 10*6/uL — ABNORMAL LOW (ref 3.80–5.20)
WBC: 4.6 x10 3/mm (ref 3.6–11.0)

## 2012-03-23 LAB — CANCER CTR PLATELET CT: Platelet: 218 x10 3/mm (ref 150–440)

## 2012-03-30 ENCOUNTER — Observation Stay: Payer: Self-pay | Admitting: Internal Medicine

## 2012-03-30 LAB — CBC WITH DIFFERENTIAL/PLATELET
Bands: 2 %
HCT: 23.5 % — ABNORMAL LOW (ref 35.0–47.0)
HGB: 7.8 g/dL — ABNORMAL LOW (ref 12.0–16.0)
MCHC: 33 g/dL (ref 32.0–36.0)
MCV: 86 fL (ref 80–100)
Monocytes: 5 %
NRBC/100 WBC: 5 /
Other Cells Blood: 2
Segmented Neutrophils: 67 %
WBC: 5.8 10*3/uL (ref 3.6–11.0)

## 2012-03-30 LAB — CREATININE, SERUM
Creatinine: 0.59 mg/dL — ABNORMAL LOW (ref 0.60–1.30)
EGFR (African American): >60
EGFR (Non-African Amer.): >60

## 2012-03-30 LAB — BILIRUBIN, TOTAL: Bilirubin,Total: 0.7 mg/dL (ref 0.2–1.0)

## 2012-03-30 LAB — RETICULOCYTES: Reticulocyte: 5.04 % — ABNORMAL HIGH (ref 0.5–2.2)

## 2012-03-30 LAB — CANCER CENTER HEMOGLOBIN: HGB: 8 g/dL — ABNORMAL LOW (ref 12.0–16.0)

## 2012-03-31 LAB — LACTATE DEHYDROGENASE: LDH: 966 U/L — ABNORMAL HIGH (ref 81–246)

## 2012-04-02 LAB — CANCER CENTER HEMOGLOBIN: HGB: 10.1 g/dL — ABNORMAL LOW (ref 12.0–16.0)

## 2012-04-06 LAB — CBC CANCER CENTER
Basophil #: 0.1 x10 3/mm (ref 0.0–0.1)
Eosinophil #: 0 x10 3/mm (ref 0.0–0.7)
HGB: 9.4 g/dL — ABNORMAL LOW (ref 12.0–16.0)
Lymphocyte #: 1.8 x10 3/mm (ref 1.0–3.6)
Lymphocyte %: 25.4 %
MCH: 29.5 pg (ref 26.0–34.0)
MCHC: 33.7 g/dL (ref 32.0–36.0)
MCV: 87 fL (ref 80–100)
Neutrophil %: 65.6 %
Platelet: 297 x10 3/mm (ref 150–440)
RDW: 38.7 % — ABNORMAL HIGH (ref 11.5–14.5)
WBC: 6.9 x10 3/mm (ref 3.6–11.0)

## 2012-04-06 LAB — RETICULOCYTES
Absolute Retic Count: 0.173 10*6/uL — ABNORMAL HIGH (ref 0.023–0.096)
Reticulocyte: 5.4 % — ABNORMAL HIGH (ref 0.5–2.2)

## 2012-04-08 ENCOUNTER — Ambulatory Visit: Payer: Self-pay | Admitting: Internal Medicine

## 2012-04-10 LAB — RETICULOCYTES: Reticulocyte: 5.66 % — ABNORMAL HIGH (ref 0.5–2.2)

## 2012-04-10 LAB — LACTATE DEHYDROGENASE: LDH: 1470 U/L — ABNORMAL HIGH (ref 81–246)

## 2012-04-10 LAB — CANCER CENTER HEMOGLOBIN: HGB: 11 g/dL — ABNORMAL LOW (ref 12.0–16.0)

## 2012-04-12 LAB — CANCER CENTER HEMOGLOBIN: HGB: 11.8 g/dL — ABNORMAL LOW (ref 12.0–16.0)

## 2012-04-18 LAB — RETICULOCYTES: Absolute Retic Count: 0.1513 10*6/uL — ABNORMAL HIGH (ref 0.023–0.096)

## 2012-04-18 LAB — CANCER CENTER HEMOGLOBIN: HGB: 11 g/dL — ABNORMAL LOW (ref 12.0–16.0)

## 2012-04-18 LAB — LACTATE DEHYDROGENASE: LDH: 1128 U/L — ABNORMAL HIGH (ref 81–246)

## 2012-04-23 LAB — CBC CANCER CENTER
Basophil #: 0 x10 3/mm (ref 0.0–0.1)
Basophil %: 0.3 %
Eosinophil %: 0.2 %
HCT: 31.8 % — ABNORMAL LOW (ref 35.0–47.0)
HGB: 10.9 g/dL — ABNORMAL LOW (ref 12.0–16.0)
Lymphocyte #: 1.3 x10 3/mm (ref 1.0–3.6)
Lymphocyte %: 13.1 %
MCHC: 34.1 g/dL (ref 32.0–36.0)
MCV: 88 fL (ref 80–100)
Neutrophil #: 8 x10 3/mm — ABNORMAL HIGH (ref 1.4–6.5)
RDW: 34.3 % — ABNORMAL HIGH (ref 11.5–14.5)

## 2012-04-27 LAB — CBC CANCER CENTER
Bands: 2 %
Basophil: 1 %
MCH: 30.2 pg (ref 26.0–34.0)
MCHC: 33.9 g/dL (ref 32.0–36.0)
Monocytes: 3 %
NRBC/100 WBC: 2 /100
Other Cells Blood: 4 %
Platelet: 115 x10 3/mm — ABNORMAL LOW (ref 150–440)
RDW: 33.6 % — ABNORMAL HIGH (ref 11.5–14.5)
Segmented Neutrophils: 73 %
WBC: 6.5 x10 3/mm (ref 3.6–11.0)

## 2012-04-27 LAB — CREATININE, SERUM
EGFR (African American): >60
EGFR (Non-African Amer.): >60

## 2012-04-27 LAB — RETICULOCYTES
Absolute Retic Count: 0.1597 10*6/uL — ABNORMAL HIGH (ref 0.023–0.096)
Reticulocyte: 4.59 % — ABNORMAL HIGH (ref 0.5–2.2)

## 2012-04-30 LAB — CBC CANCER CENTER
Basophil %: 0.8 %
Eosinophil %: 0.1 %
HCT: 30.6 % — ABNORMAL LOW (ref 35.0–47.0)
HGB: 10.4 g/dL — ABNORMAL LOW (ref 12.0–16.0)
Lymphocyte #: 0.8 x10 3/mm — ABNORMAL LOW (ref 1.0–3.6)
Lymphocyte %: 10.9 %
MCHC: 34 g/dL (ref 32.0–36.0)
Monocyte %: 7.7 %
Neutrophil #: 5.7 x10 3/mm (ref 1.4–6.5)
Neutrophil %: 80.5 %
Platelet: 99 x10 3/mm — ABNORMAL LOW (ref 150–440)
RBC: 3.47 10*6/uL — ABNORMAL LOW (ref 3.80–5.20)
WBC: 7.1 x10 3/mm (ref 3.6–11.0)

## 2012-04-30 LAB — CREATININE, SERUM
Creatinine: 0.65 mg/dL (ref 0.60–1.30)
EGFR (Non-African Amer.): >60

## 2012-04-30 LAB — LACTATE DEHYDROGENASE: LDH: 1413 U/L — ABNORMAL HIGH (ref 81–246)

## 2012-05-04 LAB — CBC CANCER CENTER
Basophil %: 1.2 %
Eosinophil %: 0.1 %
HCT: 31.7 % — ABNORMAL LOW (ref 35.0–47.0)
HGB: 10.6 g/dL — ABNORMAL LOW (ref 12.0–16.0)
MCHC: 33.5 g/dL (ref 32.0–36.0)
MCV: 89 fL (ref 80–100)
Monocyte %: 8.6 %
Neutrophil %: 79.4 %
Platelet: 75 x10 3/mm — ABNORMAL LOW (ref 150–440)
RBC: 3.56 10*6/uL — ABNORMAL LOW (ref 3.80–5.20)
WBC: 6.9 x10 3/mm (ref 3.6–11.0)

## 2012-05-09 ENCOUNTER — Ambulatory Visit: Payer: Self-pay | Admitting: Internal Medicine

## 2012-05-11 LAB — CBC CANCER CENTER
HCT: 27.2 % — ABNORMAL LOW (ref 35.0–47.0)
HGB: 9.3 g/dL — ABNORMAL LOW (ref 12.0–16.0)
Lymphocytes: 23 %
Metamyelocyte: 1 %
NRBC/100 WBC: 8 /100
RBC: 3.1 10*6/uL — ABNORMAL LOW (ref 3.80–5.20)
RDW: 30.4 % — ABNORMAL HIGH (ref 11.5–14.5)
Variant Lymphocyte: 2 %
WBC: 5.8 x10 3/mm (ref 3.6–11.0)

## 2012-05-14 LAB — CBC CANCER CENTER
Bands: 3 %
HCT: 25.2 % — ABNORMAL LOW (ref 35.0–47.0)
HGB: 8.6 g/dL — ABNORMAL LOW (ref 12.0–16.0)
Lymphocytes: 18 %
MCH: 30.3 pg (ref 26.0–34.0)
MCHC: 34.1 g/dL (ref 32.0–36.0)
MCV: 89 fL (ref 80–100)
Monocytes: 7 %
Myelocyte: 5 %
Other Cells Blood: 17 %
Platelet: 42 x10 3/mm — ABNORMAL LOW (ref 150–440)
RBC: 2.85 10*6/uL — ABNORMAL LOW (ref 3.80–5.20)
Segmented Neutrophils: 50 %
WBC: 7.4 x10 3/mm (ref 3.6–11.0)

## 2012-05-14 LAB — RETICULOCYTES
Absolute Retic Count: 0.1663 10*6/uL — ABNORMAL HIGH (ref 0.023–0.096)
Reticulocyte: 5.87 % — ABNORMAL HIGH (ref 0.5–2.2)

## 2012-05-17 LAB — CBC CANCER CENTER
Bands: 10 %
Basophil: 1 %
Blast: 13 %
HCT: 27.7 % — ABNORMAL LOW (ref 35.0–47.0)
Lymphocytes: 25 %
MCH: 30.3 pg (ref 26.0–34.0)
MCV: 88 fL (ref 80–100)
Monocytes: 6 %
Myelocyte: 1 %
Platelet: 34 x10 3/mm — ABNORMAL LOW (ref 150–440)
RDW: 23.5 % — ABNORMAL HIGH (ref 11.5–14.5)
WBC: 8.2 x10 3/mm (ref 3.6–11.0)

## 2012-05-21 LAB — CBC CANCER CENTER
Blast: 14 %
Lymphocytes: 25 %
MCH: 30.3 pg (ref 26.0–34.0)
MCHC: 33.9 g/dL (ref 32.0–36.0)
MCV: 89 fL (ref 80–100)
Metamyelocyte: 3 %
Myelocyte: 2 %
NRBC/100 WBC: 10 /100
Other Cells Blood: 5 %
RBC: 3.71 10*6/uL — ABNORMAL LOW (ref 3.80–5.20)
RDW: 20.8 % — ABNORMAL HIGH (ref 11.5–14.5)
Segmented Neutrophils: 43 %
WBC: 6.9 x10 3/mm (ref 3.6–11.0)

## 2012-05-23 ENCOUNTER — Emergency Department: Payer: Self-pay

## 2012-05-23 LAB — COMPREHENSIVE METABOLIC PANEL
Albumin: 3.3 g/dL — ABNORMAL LOW (ref 3.4–5.0)
Alkaline Phosphatase: 162 U/L — ABNORMAL HIGH (ref 50–136)
Anion Gap: 8 (ref 7–16)
BUN: 14 mg/dL (ref 7–18)
Bilirubin,Total: 0.6 mg/dL (ref 0.2–1.0)
Calcium, Total: 8.6 mg/dL (ref 8.5–10.1)
Chloride: 98 mmol/L (ref 98–107)
Co2: 28 mmol/L (ref 21–32)
EGFR (Non-African Amer.): >60
Osmolality: 270 (ref 275–301)
Potassium: 3.8 mmol/L (ref 3.5–5.1)
Sodium: 134 mmol/L — ABNORMAL LOW (ref 136–145)

## 2012-05-23 LAB — CBC
HCT: 30.4 % — ABNORMAL LOW (ref 35.0–47.0)
HGB: 10.2 g/dL — ABNORMAL LOW (ref 12.0–16.0)
MCH: 30 pg (ref 26.0–34.0)
MCHC: 33.7 g/dL (ref 32.0–36.0)
RBC: 3.41 10*6/uL — ABNORMAL LOW (ref 3.80–5.20)
RDW: 20.7 % — ABNORMAL HIGH (ref 11.5–14.5)

## 2012-05-24 LAB — CBC CANCER CENTER
Bands: 9 %
Blast: 14 %
Eosinophil: 1 %
HGB: 10.1 g/dL — ABNORMAL LOW (ref 12.0–16.0)
MCV: 88 fL (ref 80–100)
Monocytes: 3 %
Myelocyte: 5 %
NRBC/100 WBC: 11 /100
Segmented Neutrophils: 34 %
Variant Lymphocyte: 3 %
WBC: 5.2 x10 3/mm (ref 3.6–11.0)

## 2012-05-26 ENCOUNTER — Emergency Department: Payer: Self-pay | Admitting: Emergency Medicine

## 2012-05-26 LAB — COMPREHENSIVE METABOLIC PANEL
Anion Gap: 9 (ref 7–16)
Calcium, Total: 8.1 mg/dL — ABNORMAL LOW (ref 8.5–10.1)
Chloride: 100 mmol/L (ref 98–107)
Co2: 26 mmol/L (ref 21–32)
Creatinine: 0.55 mg/dL — ABNORMAL LOW (ref 0.60–1.30)
EGFR (African American): >60
EGFR (Non-African Amer.): >60
Glucose: 112 mg/dL — ABNORMAL HIGH (ref 65–99)
Potassium: 3.8 mmol/L (ref 3.5–5.1)
SGOT(AST): 31 U/L (ref 15–37)
SGPT (ALT): 28 U/L (ref 12–78)
Sodium: 135 mmol/L — ABNORMAL LOW (ref 136–145)

## 2012-05-26 LAB — CBC
HCT: 27.3 % — ABNORMAL LOW (ref 35.0–47.0)
HGB: 8.7 g/dL — ABNORMAL LOW (ref 12.0–16.0)
MCH: 28.1 pg (ref 26.0–34.0)
MCHC: 31.8 g/dL — ABNORMAL LOW (ref 32.0–36.0)
MCV: 88 fL (ref 80–100)
Platelet: 20 10*3/uL — CL (ref 150–440)
RBC: 3.09 10*6/uL — ABNORMAL LOW (ref 3.80–5.20)
RDW: 20.1 % — ABNORMAL HIGH (ref 11.5–14.5)
WBC: 4.9 10*3/uL (ref 3.6–11.0)

## 2012-05-26 LAB — URINALYSIS, COMPLETE
Bacteria: NONE SEEN
Bilirubin,UR: NEGATIVE
Blood: NEGATIVE
Glucose,UR: NEGATIVE mg/dL (ref 0–75)
Leukocyte Esterase: NEGATIVE
Nitrite: NEGATIVE
Protein: NEGATIVE
RBC,UR: <1 /HPF (ref 0–5)

## 2012-05-26 LAB — TROPONIN I: Troponin-I: <0.02 ng/mL

## 2012-05-26 LAB — PROTIME-INR: INR: 1

## 2012-05-26 LAB — APTT: Activated PTT: 33.4 secs (ref 23.6–35.9)

## 2012-06-09 ENCOUNTER — Ambulatory Visit: Payer: Self-pay | Admitting: Internal Medicine

## 2012-06-09 DEATH — deceased

## 2014-07-25 IMAGING — CT CT HEAD WITHOUT CONTRAST
2 series · 15 of 30 positions shown, 19 images · non-contrast
Comparison: none

REASON FOR EXAM: weakness
COMMENTS:

[Series 2: without · axial · non-contrast · 0.45mm/px · z∈[-167,-37]mm · 13 of 32 slices shown, 17 images]
[im 3/32  brain]
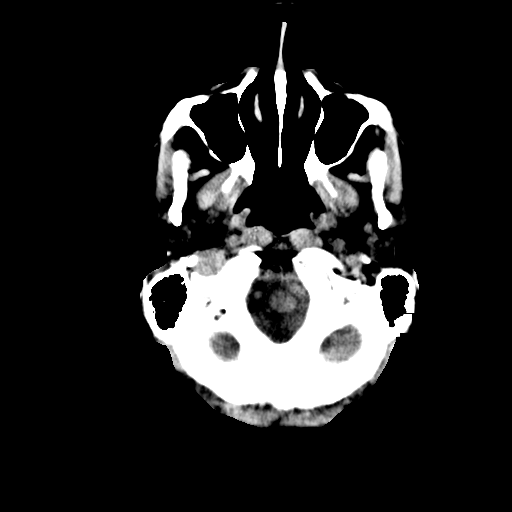
[im 3/32  bone]
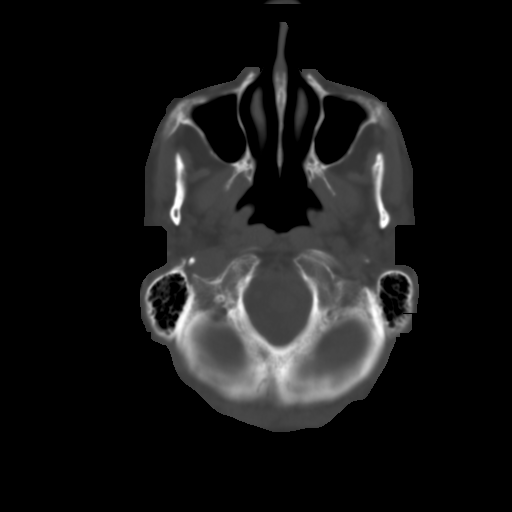
[im 5/32  brain]
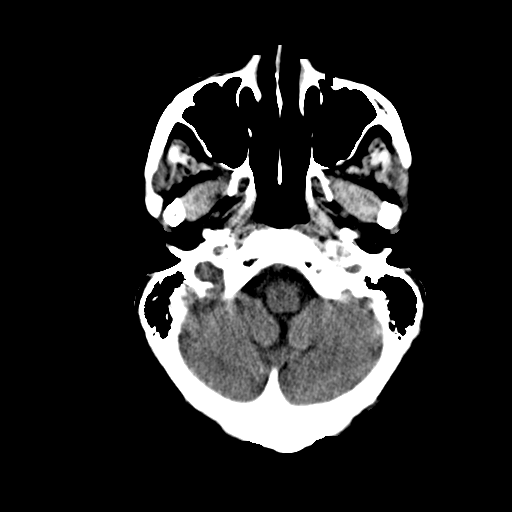
[im 7/32  brain]
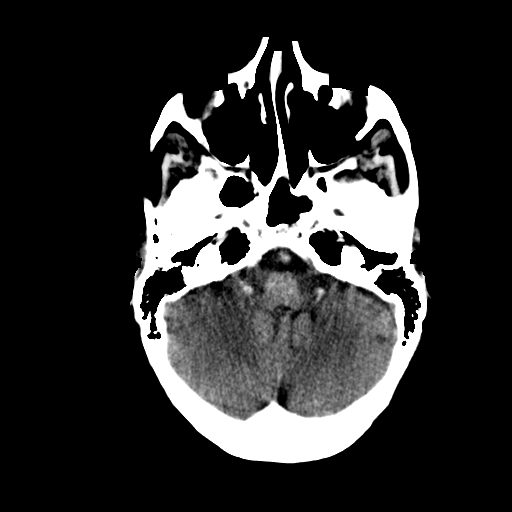
[im 9/32  brain]
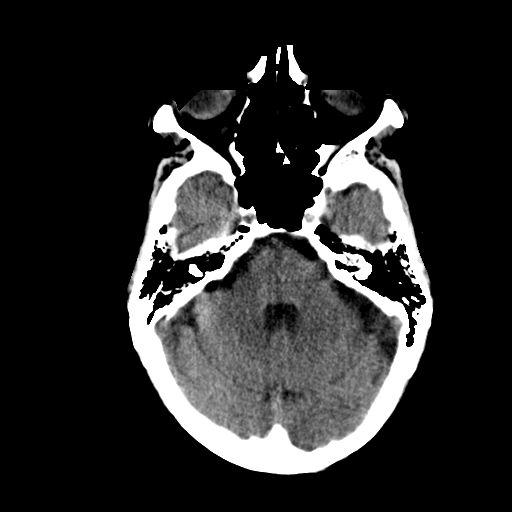
[im 12/32  brain]
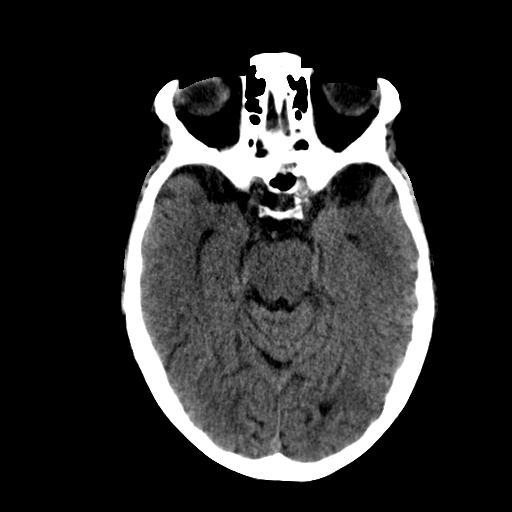
[im 12/32  bone]
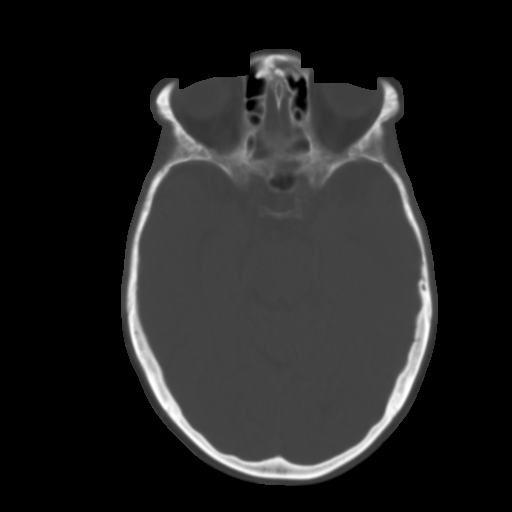
[im 14/32  brain]
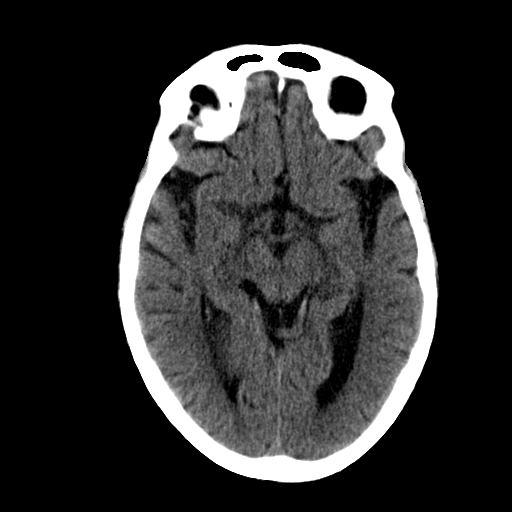
[im 16/32  brain]
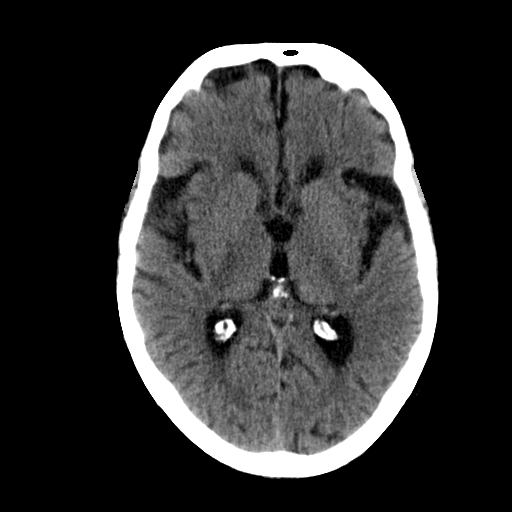
[im 18/32  brain]
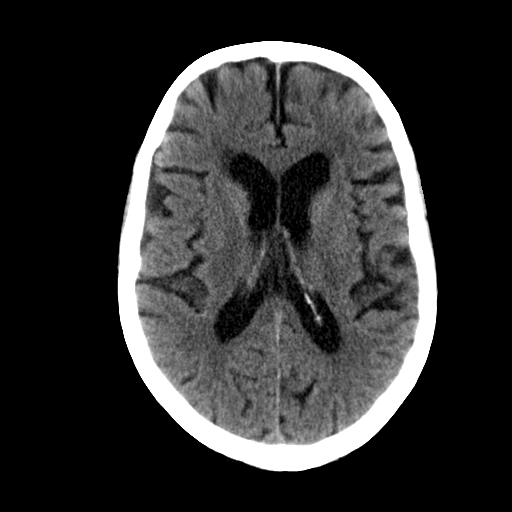
[im 20/32  brain]
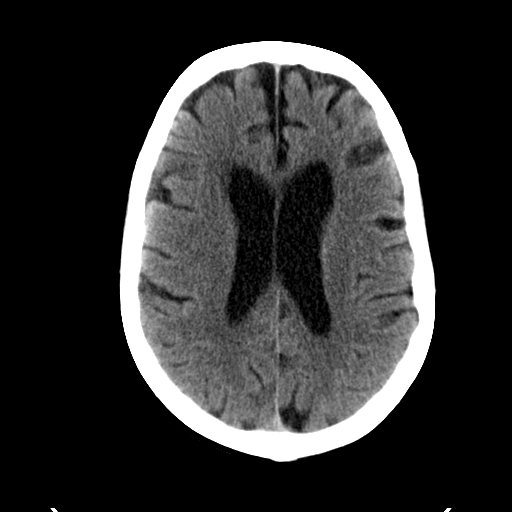
[im 20/32  bone]
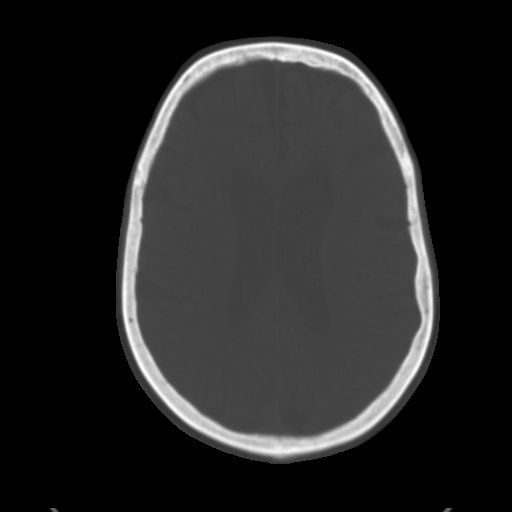
[im 23/32  brain]
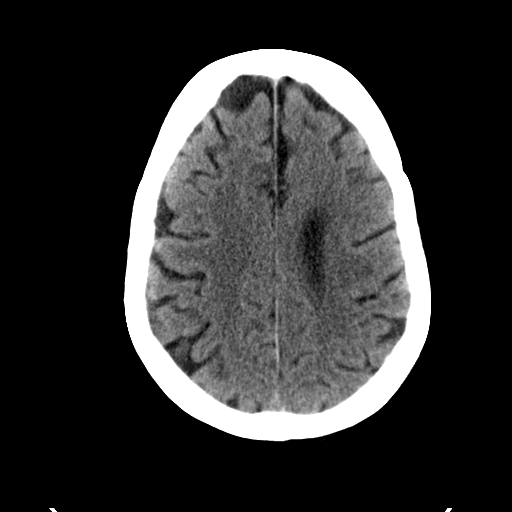
[im 25/32  brain]
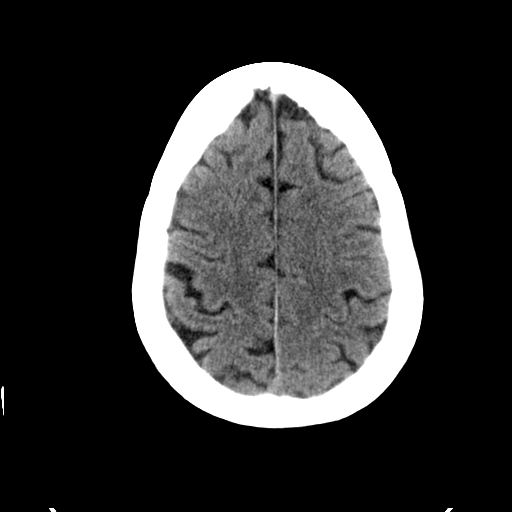
[im 27/32  brain]
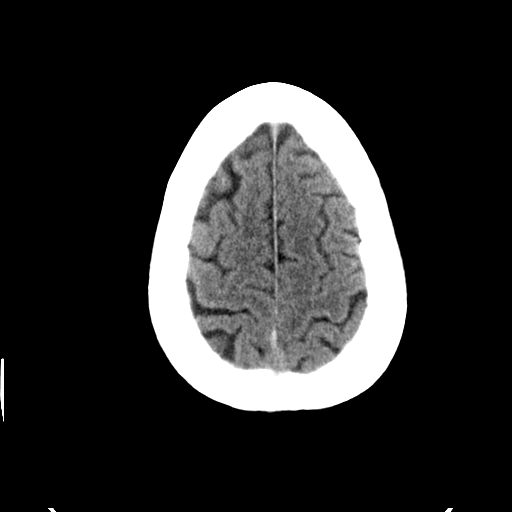
[im 29/32  brain]
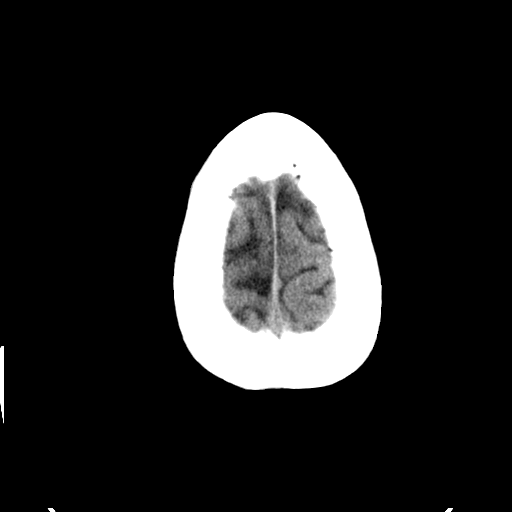
[im 29/32  bone]
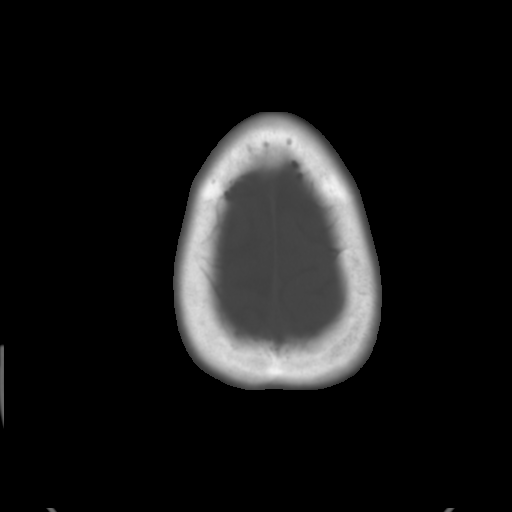

[Series 3: bone · axial · 0.45mm/px · z∈[-167,-147]mm · 2 of 32 slices shown]
[im 3/32  bone]
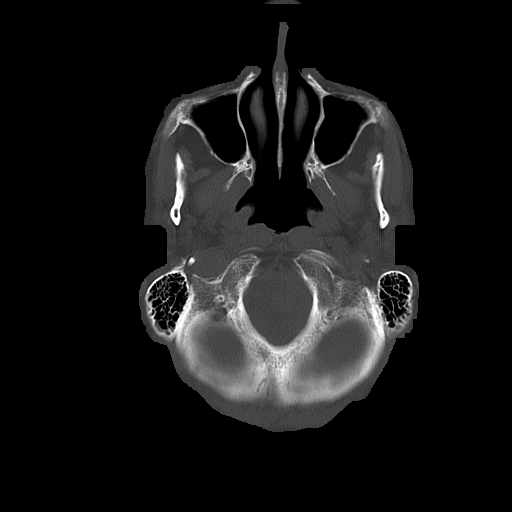
[im 7/32  bone]
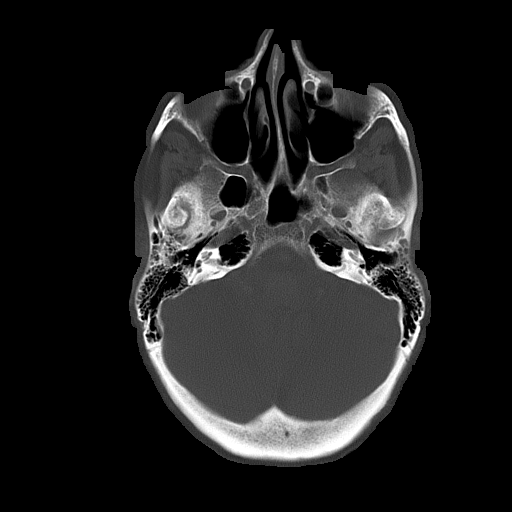

[15 of 30 positions shown; findings below may reference images not displayed]

PROCEDURE:     CT  - CT HEAD WITHOUT CONTRAST  - December 19, 2011 [DATE]

RESULT:     CT of the brain without contrast is compared to the previous
exam dated [DATE] thousand 103

There is prominence of the ventricles and sulci consistent with atrophy.
Mild low-attenuation in the periventricular white matter is consistent with
chronic microvascular ischemic disease. There is low-attenuation in the left
frontal lobe seen on images 20 and 21 which could represent a prominent
sulcus. If there is concern for an acute ischemic event or evolving infarct
followup MRI is available. There is grossly normal aeration of the paranasal
sinuses and mastoid air cells. The calvarium appears intact.
IMPRESSION: Changes of atrophy with chronic microvascular ischemic
disease. Possible prominent a sulcus in the left frontal region. A small
evolving infarct is not excluded. Correlate clinically. MRI followup is
available if clinically needed. No evidence of intracranial hemorrhage.

[REDACTED]

## 2014-08-26 NOTE — H&P (Signed)
PATIENT NAME:  Carol Acevedo, Carol Acevedo MR#:  [id number] DATE OF BIRTH:  11-10-1932  DATE OF ADMISSION:  03/09/2012  HISTORY OF PRESENT ILLNESS: Ms. Caskey is a 81 year old patient well known to me and seen in the clinic and is hospitalized now due to symptomatic anemia requiring 2 units of packed red blood cells requires inpatient due to the late hour and the need for 2 units of blood. Patient has essential thrombocytosis and recently become more anemic. She has recently been started on Procrit which so far has been ineffective. Status post a unit of blood two weeks ago. Today patient comes in symptomatic, too dizzy to walk or stand and out of breath on exertion but not out of breath sitting still. No chest pain. No palpitations. No ear or jaw pain. No nausea, vomiting, diarrhea. No fever, chills, or sweats. No abdominal pain. No focal weakness. No rash or bruising. Stools have been normal, not dark, no blood and they have previously been negative for guaiac. The hemoglobin today is 6.8 and patient is recommended again for hospitalization for 2 units of transfusion with irradiated packed red blood cells and continued but increase in the Procrit support beginning today to a higher dose given in the clinic. Plan to continue other regular medications and continue later outpatient follow up.   PAST MEDICAL HISTORY:  1. Arthritis in the past on methotrexate. 2. Had an upper GI bleed and H. pylori in the past. 3. History of compensated depression.  4. Hypothyroid.  5. Hyperlipidemia.  6. Osteoporosis.  7. Has had prior wrist surgery. 8. Hysterectomy.   SOCIAL HISTORY: No alcohol, no tobacco.   ALLERGIES: Lodine, sulfa drugs, Toradol, Trilisate, Tolectin and NSAIDs all cause GI distress.   HOME MEDICATIONS:  1. Caltrate 600 mg taken once a day at noon. 2. Citalopram 40 mg taken once a day in the evening.  3. Folic acid 800 mg taken once a day in the morning.  4. Metoprolol tartrate 50 mg taken once a day in  the morning.  5. Nexium 40 mg taken twice a day. 6. Synthroid 100 mcg taken once a day in the morning.  7. Vitamin D 400 units taken once a day at noon. 8. Aspirin 81 mg oral taken once a day in the evening. 9. Ibuprofen prior taken p.r.n. recently.  10. MiraLax occasionally taken p.r.n.  11. One-A-Day vitamins taken daily.  12. In the past patient was on Hydrea which has been discontinued in the last month due to anemia.   PHYSICAL EXAMINATION:  GENERAL: Alert and cooperative, no acute distress while at rest, currently lying on the stretcher in extreme pallor. No jaundice.   HEENT: Sclerae clear.   MOUTH: No thrush.   NECK: No mass.   LYMPH: No palpable lymph nodes in the neck, supraclavicular, submandibular, or axilla.   HEART: Regular.   LUNGS: Clear. No wheezing, rales, or rhonchi.   ABDOMEN: Nontender. No palpable mass or organomegaly.   EXTREMITIES: No extremity edema.   NEUROLOGIC: Grossly nonfocal. Cranial nerves intact. Moving all extremities against gravity. Good strength. Did not test the gait.   LABORATORY, DIAGNOSTIC AND RADIOLOGICAL DATA: Platelets 200,000. Neutrophils are normal. There are nucleated red cells with no significant immature white cells in the smear. No blasts on the smear. Hemoglobin 6.8. Noted recently B12 immunoelectrophoresis normal as was recently three stools were guaiac-negative. Recent erythropoietin level prior to any treatment was 102.   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 Currently white count and platelets are unremarkable. No bleeding. Clearly symptomatic anemia.   PLAN: Patient will be hospitalized and transfused 2 units of packed red blood cells. Give each unit over three hours. Allow two hours between units of blood. Would give premedication with Tylenol and low-dose Benadryl before each unit and would give a low  dose of Lasix before the second unit. Patient continues her regular medications. Patient also given a dose of Procrit at a higher dose today in the clinic. Anticipating discharge in less than 24 hours if patient tolerates the blood well and then next week continue clinic follow up.   ____________________________ Knute Neuobert G. Lorre NickGittin, MD rgg:cms D: 03/09/2012 11:03:31 ET T: 03/09/2012 11:19:58 ET JOB#: [id number]  cc: Knute Neuobert G. Lorre NickGittin, MD, <Dictator> Marin RobertsOBERT G Janelly Switalski MD ELECTRONICALLY SIGNED 03/15/2012 11:50

## 2014-08-26 NOTE — H&P (Signed)
PATIENT NAME:  Carol Acevedo, Carol Acevedo MR#:  [id number] DATE OF BIRTH:  23-Nov-1932  DATE OF ADMISSION:  03/30/2012  HISTORY OF PRESENT ILLNESS: Carol Acevedo is a 81 year old patient who was admitted to the hospital for packed red blood cell transfusion for symptomatic anemia. The patient has myeloproliferative disease and has had progressive anemia and has been suspected to have myelofibrosis by virtue of clinical history, the appearance of the peripheral smear. She does not have current fever, chills, or sweats, but has had sweats at home and elevated temperatures intermittently for a number of months with no evidence of infection. She does not have abdominal pain, nausea, vomiting, diarrhea. No headache but has had dizziness, orthostasis, increased weakness, mild nausea, shortness of breath on minimal exertion more recently; and this corresponds to the hemoglobin falling from 9 grams to 8 grams. She does not have cough or wheezing or hemoptysis. No visual disturbances. No ear or jaw pain. No dysuria or hematuria. No diarrhea. No dark stools or blood in stools. No edema. No focal weakness. No rash, bruising,  or pruritus.   PAST MEDICAL HISTORY:  Inflammatory arthritis, prior on methotrexate, although no severe joint pain currently, history of H. pylori and upper GI bleed in the past, depression, hypothyroidism, hyperlipidemia, osteoporosis, hysterectomy, wrist surgery.   ALLERGIES: Lodine, sulfa, Toradol, Trilisate, Tolectin, and all NSAIDs cause gastrointestinal distress.   CURRENT MEDICATIONS:  1. Citalopram 40 mg daily.  2. Folic acid 0.8 mg daily.  3. Metoprolol 50 mg daily.  4. Nexium 40 mg twice a day. 5. Synthroid 100 mcg daily. 6. Vitamin D 400 units daily.  7. Aspirin 81 mg daily.  8. Ibuprofen p.r.n.  9. MiraLAX daily.  10. In the past was on but has stopped taking Hydrea since September of 2013.   The patient has had the flu shot this season in October 2013.   FAMILY HISTORY:  Noncontributory.   SOCIAL HISTORY: No alcohol or tobacco.   PHYSICAL EXAMINATION:   GENERAL: Alert and cooperative. She has pallor. No jaundice.   HEENT: Sclerae clear.   MOUTH: No thrush.   NECK: Supple. No adenopathy.   ABDOMEN: No palpable organomegaly. No palpable mass.   EXTREMITIES: No edema. No calf tenderness.  NEUROLOGIC: Nonfocal. Cranial nerves intact.    CARDIOVASCULAR: No cough or wheezing or rales.   HEART: Regular. The patient has had a left sternal border murmur.  flow murmur in the past.   LABORATORIES: Recent platelet count is 200,000. The white count has been normal. The hemoglobin is 8 grams. Reticulocytes are 5.2. Absolute reticulocytes are compatible with some marrow response but blunted compared to the degree of anemia.   More recently, creatinine and liver functions were normal, currently pending. Prior Coombs test in September was negative, but a warm autoantibody IgM type complement mediated was identified on 03/09/2012. LDH was 383.   IMPRESSION AND PLAN:  1. Patient with myeloproliferative disease, with progressive anemia, has now failed Procrit three consecutive weeks at 60,000 units and although there is some reticulocyte response the hemoglobin is still falling in the absence of any evidence of bleeding. For symptomatic anemia with weakness and dizziness, the patient will be given a unit of packed red blood cells and be reevaluated  for possibly a second unit of blood.  2. Patient has identification recently of a warm autoantibody. There has not been clinical evidence or strong suspicion that there is a hemolytic component. Patient will be reevaluated for any clinically significant hemolysis. Since this has  been identified as an IgM antibody it is more likely be self-limiting and unlikely to be very prednisone responsive, but would start prednisone, a low dose, adjust for the patient's tolerance, try to titrate up, and watch for any element of a treatable  immune disorder.  3. We will continue patient's regular chronic medications.  4. [facility].  5. Would document the chest x-ray with previous fevers and sweats, although the lungs have been clear. There is currently no fever.  6. Rechecked bilirubin, LDH, Coombs, and haptoglobin.  A Jak2:12 mutation is pending as is a parvovirus result. Creatinine will be repeated as well. IV saline lock until blood is available.   PLAN: The first unit of blood with Tylenol and Benadryl premedication.    ____________________________ Knute Neu Lorre Nick, MD rgg:vtd D: 03/30/2012 17:11:18 ET T: 03/30/2012 19:24:43 ET JOB#: [id number]  cc: Knute Neu. Lorre Nick, MD, <Dictator> Marin Roberts MD ELECTRONICALLY SIGNED 04/24/2012 22:02

## 2014-09-23 IMAGING — CT CT PARANASAL SINUSES W/O CM
1 series · 16 of 30 positions shown, 20 images · non-contrast
Comparison: none

REASON FOR EXAM: headache and fever
COMMENTS:

PROCEDURE:     CT  - CT SINUSES WITHOUT CONTRAST  - February 17, 2012  [DATE]
RESULT:     History: Fever and headache.

[Series 2: axial facial 2.0 h70h · axial · 0.44mm/px · z∈[-168,-10]mm · 16 of 171 slices shown, 20 images]
[im 6/171  brain]
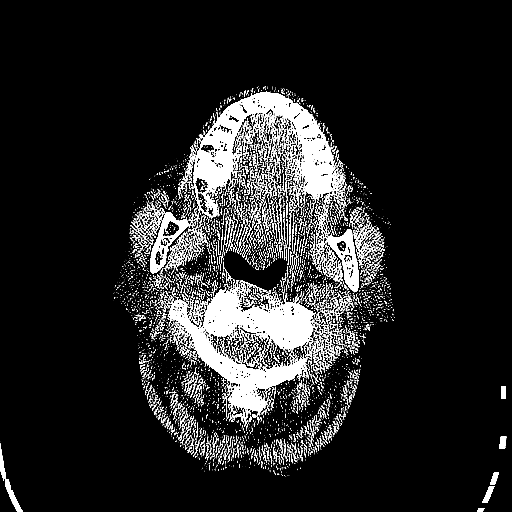
[im 6/171  bone]
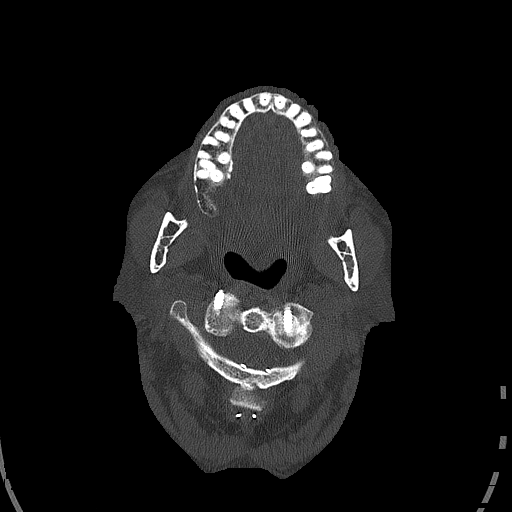
[im 18/171  bone]
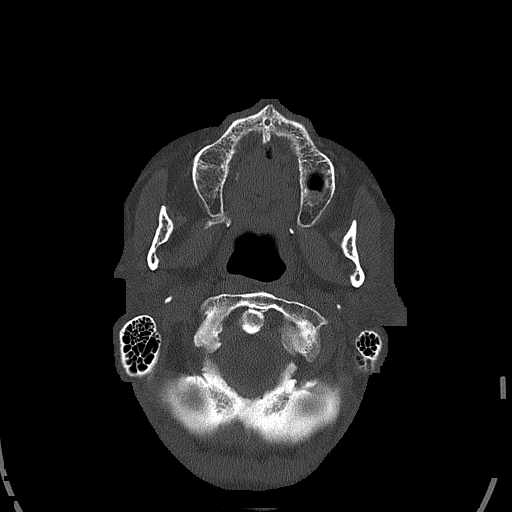
[im 30/171  bone]
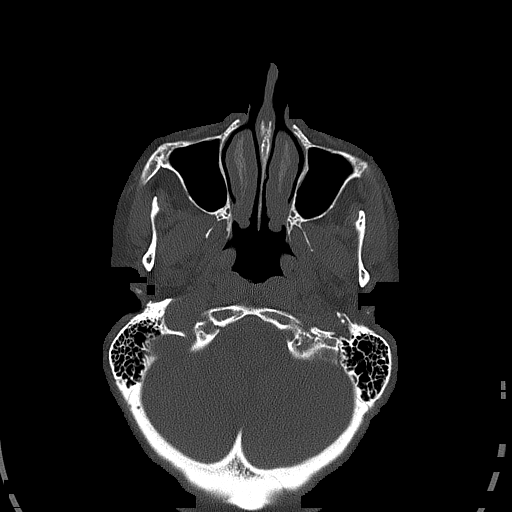
[im 42/171  bone]
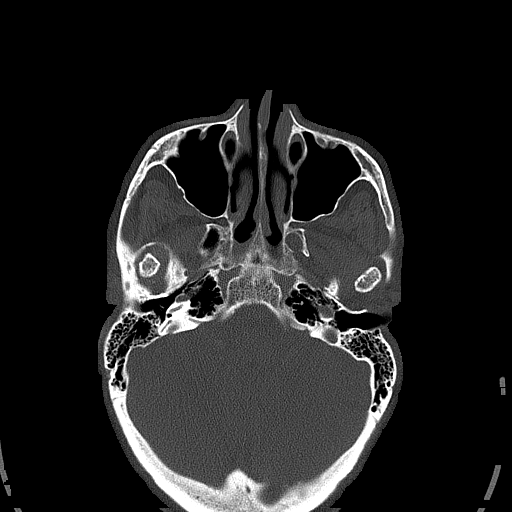
[im 47/171  brain]
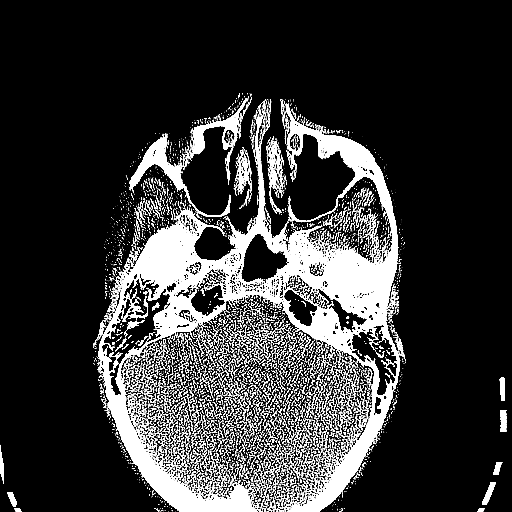
[im 47/171  bone]
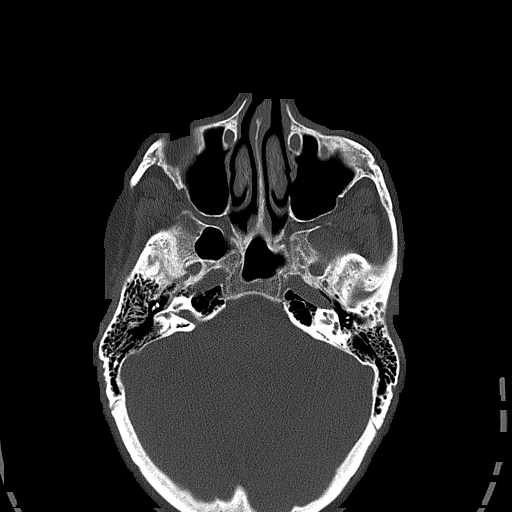
[im 59/171  bone]
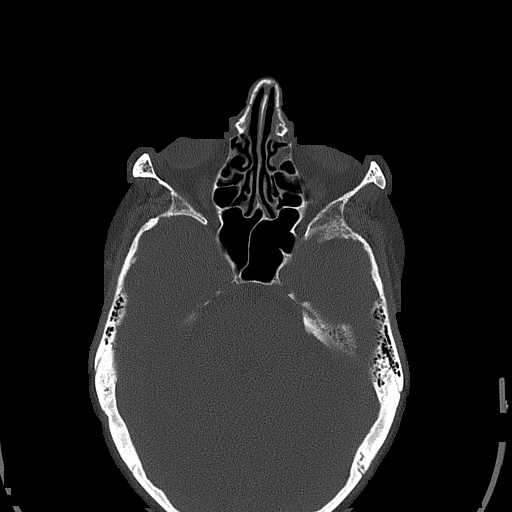
[im 71/171  bone]
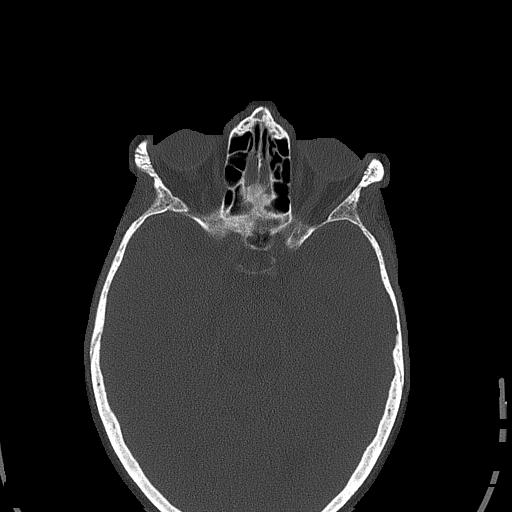
[im 83/171  bone]
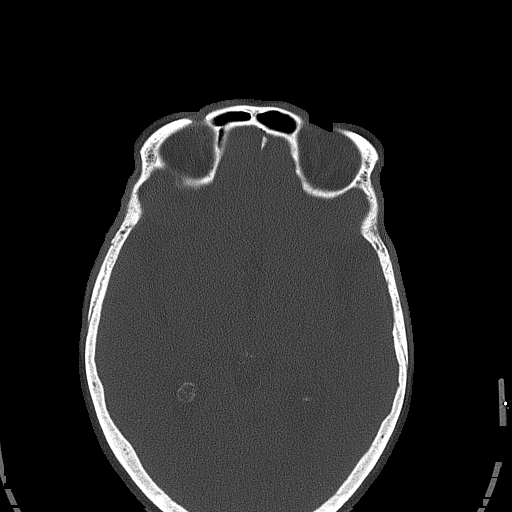
[im 88/171  brain]
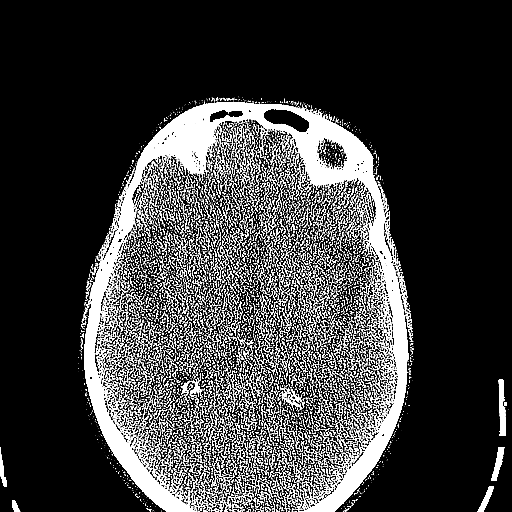
[im 88/171  bone]
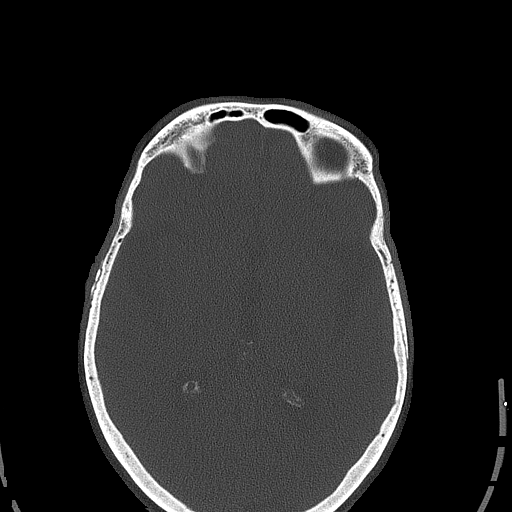
[im 100/171  bone]
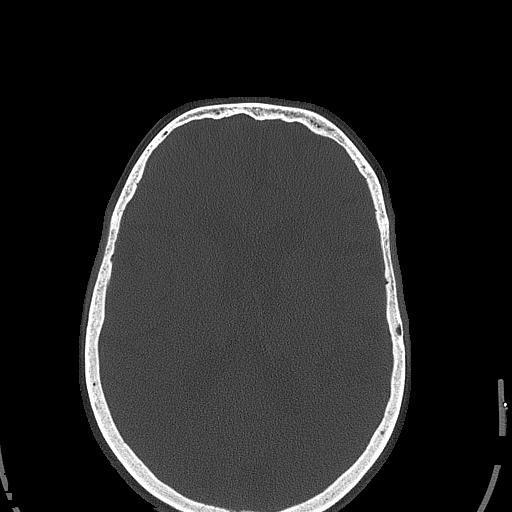
[im 112/171  bone]
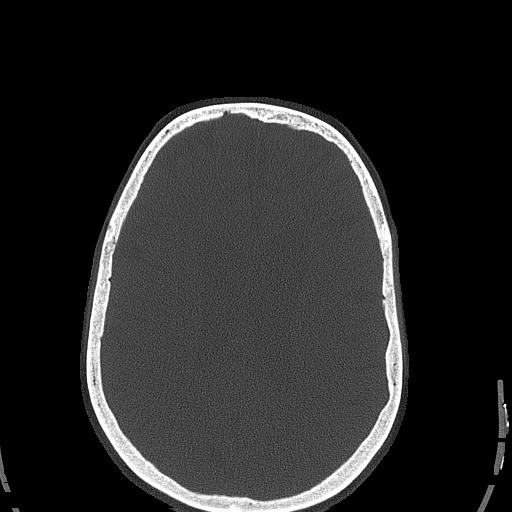
[im 124/171  bone]
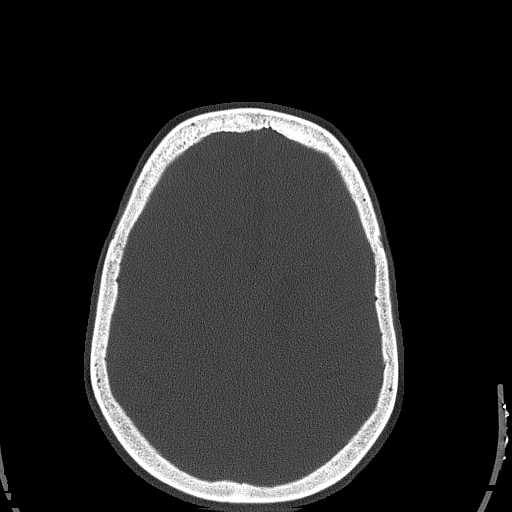
[im 129/171  brain]
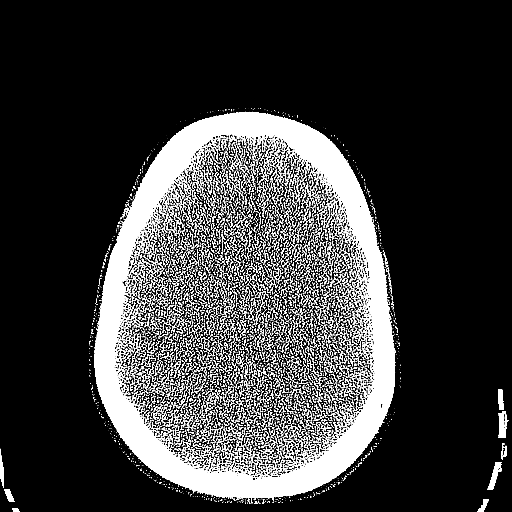
[im 129/171  bone]
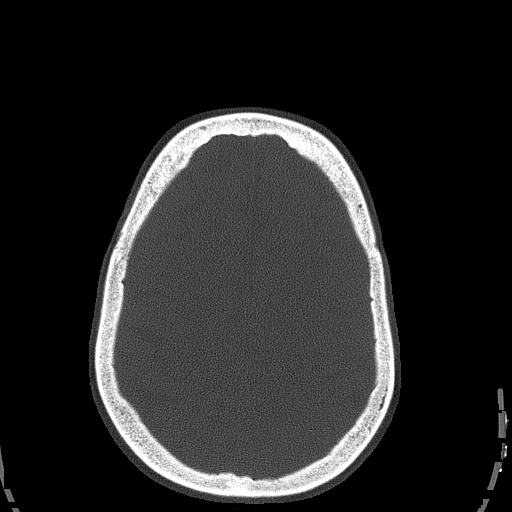
[im 141/171  bone]
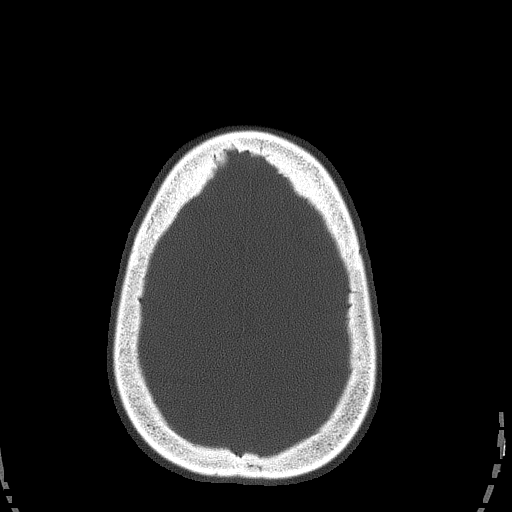
[im 153/171  bone]
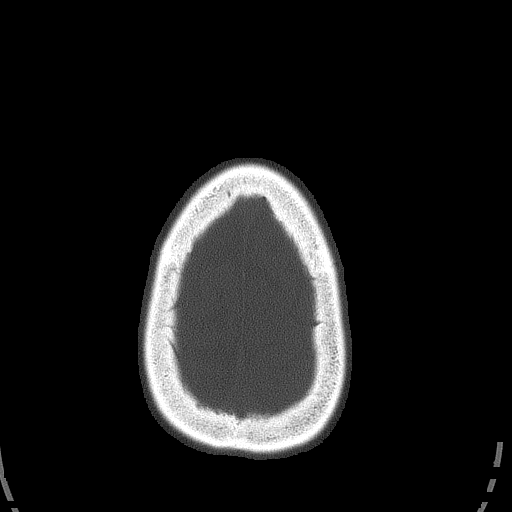
[im 165/171  bone]
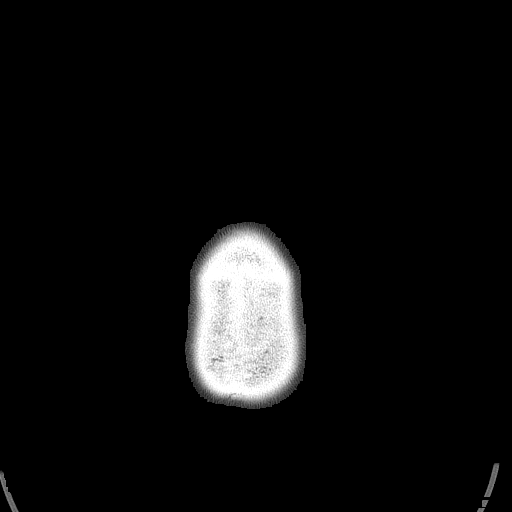

[16 of 30 positions shown; findings below may reference images not displayed]

FINDINGS: Standard CT of the paranasal sinuses obtained. Mild mucosal
thickening of the left ethmoid sinus complex. Similar finding in the left
maxillary sinus. No acute bony abnormality identified. Maxillary ostia
patent. Small concha bullosa deformity noted right middle nasal turbinate.
Nasopharynx is clear. Mastoids are clear. Patient has had prior cervical
spine fusion.
IMPRESSION: Minimal thickening noted in left ethmoid sinuses and left
maxillary sinus. No evidence of prominent sinusitis. No acute abnormality
identified. Mastoids are clear.
